# Patient Record
Sex: Female | Born: 1955 | Race: White | Hispanic: No | Smoking: Never smoker
Health system: Southern US, Community
[De-identification: ages and names within clinical notes are randomized; demographics above are authoritative.]

## PROBLEM LIST (undated history)

## (undated) DIAGNOSIS — D75839 Thrombocytosis, unspecified: Secondary | ICD-10-CM

## (undated) DIAGNOSIS — K219 Gastro-esophageal reflux disease without esophagitis: Secondary | ICD-10-CM

## (undated) DIAGNOSIS — E559 Vitamin D deficiency, unspecified: Secondary | ICD-10-CM

## (undated) DIAGNOSIS — B977 Papillomavirus as the cause of diseases classified elsewhere: Secondary | ICD-10-CM

## (undated) DIAGNOSIS — M810 Age-related osteoporosis without current pathological fracture: Secondary | ICD-10-CM

## (undated) HISTORY — DX: Vitamin D deficiency, unspecified: E55.9

## (undated) HISTORY — DX: Age-related osteoporosis without current pathological fracture: M81.0

## (undated) HISTORY — PX: TUBAL LIGATION: SHX77

## (undated) HISTORY — DX: Papillomavirus as the cause of diseases classified elsewhere: B97.7

## (undated) HISTORY — PX: WISDOM TOOTH EXTRACTION: SHX21

## (undated) HISTORY — PX: COLONOSCOPY: SHX174

---

## 1987-03-02 HISTORY — PX: BREAST EXCISIONAL BIOPSY: SUR124

## 1987-03-02 HISTORY — PX: BREAST SURGERY: SHX581

## 1997-03-01 HISTORY — PX: EYE SURGERY: SHX253

## 2004-11-12 ENCOUNTER — Ambulatory Visit: Payer: Self-pay | Admitting: Internal Medicine

## 2004-11-27 ENCOUNTER — Ambulatory Visit: Payer: Self-pay | Admitting: Internal Medicine

## 2005-12-31 ENCOUNTER — Ambulatory Visit: Payer: Self-pay | Admitting: Internal Medicine

## 2006-06-03 ENCOUNTER — Ambulatory Visit: Payer: Self-pay | Admitting: Gastroenterology

## 2007-01-25 ENCOUNTER — Ambulatory Visit: Payer: Self-pay | Admitting: Internal Medicine

## 2008-02-06 ENCOUNTER — Ambulatory Visit: Payer: Self-pay | Admitting: Internal Medicine

## 2009-02-07 ENCOUNTER — Ambulatory Visit: Payer: Self-pay | Admitting: Internal Medicine

## 2010-02-19 ENCOUNTER — Ambulatory Visit: Payer: Self-pay | Admitting: Internal Medicine

## 2011-03-05 LAB — LIPID PANEL
Cholesterol: 193 mg/dL (ref 0–200)
HDL: 73 mg/dL — AB (ref 35–70)

## 2011-03-15 LAB — LIPID PANEL: LDL Cholesterol: 98 mg/dL

## 2011-04-20 ENCOUNTER — Ambulatory Visit: Payer: Self-pay | Admitting: Internal Medicine

## 2012-03-08 ENCOUNTER — Encounter: Payer: Self-pay | Admitting: *Deleted

## 2012-03-08 DIAGNOSIS — M810 Age-related osteoporosis without current pathological fracture: Secondary | ICD-10-CM

## 2012-03-08 DIAGNOSIS — E559 Vitamin D deficiency, unspecified: Secondary | ICD-10-CM

## 2012-03-09 ENCOUNTER — Encounter: Payer: Self-pay | Admitting: Internal Medicine

## 2012-03-09 ENCOUNTER — Ambulatory Visit (INDEPENDENT_AMBULATORY_CARE_PROVIDER_SITE_OTHER): Payer: Managed Care, Other (non HMO) | Admitting: Internal Medicine

## 2012-03-09 ENCOUNTER — Encounter: Payer: Self-pay | Admitting: *Deleted

## 2012-03-09 ENCOUNTER — Other Ambulatory Visit (HOSPITAL_COMMUNITY)
Admission: RE | Admit: 2012-03-09 | Discharge: 2012-03-09 | Disposition: A | Discharge status: Home / Self Care | Payer: Managed Care, Other (non HMO) | Source: Ambulatory Visit | Attending: Internal Medicine | Admitting: Internal Medicine

## 2012-03-09 VITALS — BP 103/68 | HR 67 | Temp 98.5°F | Ht 64.0 in | Wt 151.0 lb

## 2012-03-09 DIAGNOSIS — E559 Vitamin D deficiency, unspecified: Secondary | ICD-10-CM

## 2012-03-09 DIAGNOSIS — R5383 Other fatigue: Secondary | ICD-10-CM

## 2012-03-09 DIAGNOSIS — M810 Age-related osteoporosis without current pathological fracture: Secondary | ICD-10-CM | POA: Insufficient documentation

## 2012-03-09 DIAGNOSIS — Z1151 Encounter for screening for human papillomavirus (HPV): Secondary | ICD-10-CM | POA: Insufficient documentation

## 2012-03-09 DIAGNOSIS — Z01419 Encounter for gynecological examination (general) (routine) without abnormal findings: Secondary | ICD-10-CM | POA: Insufficient documentation

## 2012-03-09 DIAGNOSIS — Z139 Encounter for screening, unspecified: Secondary | ICD-10-CM

## 2012-03-09 LAB — CBC WITH DIFFERENTIAL/PLATELET
Basophils Relative: 0.8 % (ref 0.0–3.0)
Eosinophils Absolute: 0.1 10*3/uL (ref 0.0–0.7)
Eosinophils Relative: 1.2 % (ref 0.0–5.0)
Lymphocytes Relative: 36.3 % (ref 12.0–46.0)
Monocytes Relative: 7.1 % (ref 3.0–12.0)
Neutrophils Relative %: 54.6 % (ref 43.0–77.0)
RBC: 4.57 Mil/uL (ref 3.87–5.11)
WBC: 6.6 10*3/uL (ref 4.5–10.5)

## 2012-03-09 LAB — LIPID PANEL
Cholesterol: 182 mg/dL (ref 0–200)
LDL Cholesterol: 89 mg/dL (ref 0–99)

## 2012-03-09 LAB — COMPREHENSIVE METABOLIC PANEL
AST: 20 U/L (ref 0–37)
Albumin: 4.3 g/dL (ref 3.5–5.2)
BUN: 20 mg/dL (ref 6–23)
Calcium: 9.4 mg/dL (ref 8.4–10.5)
Chloride: 102 mEq/L (ref 96–112)
Glucose, Bld: 90 mg/dL (ref 70–99)
Potassium: 4.1 mEq/L (ref 3.5–5.1)

## 2012-03-12 ENCOUNTER — Encounter: Payer: Self-pay | Admitting: Internal Medicine

## 2012-03-12 NOTE — Assessment & Plan Note (Signed)
Check vitamin D level 

## 2012-03-12 NOTE — Progress Notes (Signed)
  Subjective:    [name], female    DOB: [birth year], 57 y.o.   MRN: [id number]  HPI 57 year old female with past history of osteoporosis who comes in today for a follow up as well as for her complete physical exam.  She states she is doing well.  No chest pain or tightness.  No increased sob.  Breathing stable.  Eating and drinking well.  Overall she feels good.  Bowels stable.   Past Medical History  Diagnosis Date  . Osteoporosis   . Vitamin D deficiency     Current Outpatient Prescriptions on File Prior to Visit  Medication Sig Dispense Refill  . LORazepam (ATIVAN) 0.5 MG tablet Take 0.5 mg by mouth 2 (two) times daily.      . Multiple Vitamins-Minerals (MULTIVITAMIN PO) Take by mouth.      Marland Kitchen VITAMIN D, CHOLECALCIFEROL, PO Take by mouth.        Review of Systems Patient denies any headache, lightheadedness or dizziness.  No sinus or allergy symptoms.   No chest pain, tightness or palpitations.  No increased shortness of breath, cough or congestion.  No nausea or vomiting.  No acid reflux.  No abdominal pain or cramping.  No bowel change, such as diarrhea, constipation, BRBPR or melana.  No urine change.        Objective:   Physical Exam Filed Vitals:   03/09/12 0837  BP: 103/68  Pulse: 67  Temp: 98.5 F (52.59 C)   57 year old female in no acute distress.   HEENT:  Nares- clear.  Oropharynx - without lesions. NECK:  Supple.  Nontender.  No audible bruit.  HEART:  Appears to be regular. LUNGS:  No crackles or wheezing audible.  Respirations even and unlabored.  RADIAL PULSE:  Equal bilaterally.    BREASTS:  No nipple discharge or nipple retraction present.  Could not appreciate any distinct nodules or axillary adenopathy.  ABDOMEN:  Soft, nontender.  Bowel sounds present and normal.  No audible abdominal bruit.  GU:  Normal external genitalia.  Vaginal vault without lesions.  Cervix identified.  Pap performed. Could not appreciate any adnexal masses or  tenderness.   RECTAL:  Heme negative.   EXTREMITIES:  No increased edema present.  DP pulses palpable and equal bilaterally.           Assessment & Plan:  INCREASED PSYCHOSOCIAL STRESSORS.  Doing well.  Has lorazepam if needed.  Rarely uses.   FATIGUE.  Did report some fatigue, but overall feels she is doing well.  Check cbc, met c and tsh.    HEALTH MAINTENANCE.  Physical today.  Schedule mammogram.  Colonoscopy 4/4 08 with two 3mm polyps removed. Recommended follow up in 10 years.  Hyperplastic polyps.  IFOB given.

## 2012-03-12 NOTE — Assessment & Plan Note (Signed)
Off Fosamax.  Continue calcium and vitamin D.  Continue weight bearing exercises.  Check vitamin D level.  Will follow with repeat bone density - when agreeable.

## 2012-03-14 ENCOUNTER — Encounter: Payer: Self-pay | Admitting: *Deleted

## 2012-04-20 ENCOUNTER — Ambulatory Visit: Payer: Self-pay | Admitting: Internal Medicine

## 2012-05-03 ENCOUNTER — Encounter: Payer: Self-pay | Admitting: Internal Medicine

## 2013-03-12 ENCOUNTER — Encounter: Payer: Self-pay | Admitting: Internal Medicine

## 2013-03-12 ENCOUNTER — Other Ambulatory Visit: Payer: Self-pay | Admitting: Internal Medicine

## 2013-03-12 ENCOUNTER — Ambulatory Visit (INDEPENDENT_AMBULATORY_CARE_PROVIDER_SITE_OTHER): Payer: Managed Care, Other (non HMO) | Admitting: Internal Medicine

## 2013-03-12 VITALS — BP 120/80 | HR 76 | Temp 97.7°F | Ht 63.0 in | Wt 141.2 lb

## 2013-03-12 DIAGNOSIS — Z0000 Encounter for general adult medical examination without abnormal findings: Secondary | ICD-10-CM

## 2013-03-12 DIAGNOSIS — Z13220 Encounter for screening for lipoid disorders: Secondary | ICD-10-CM

## 2013-03-12 DIAGNOSIS — E559 Vitamin D deficiency, unspecified: Secondary | ICD-10-CM

## 2013-03-12 DIAGNOSIS — Z1211 Encounter for screening for malignant neoplasm of colon: Secondary | ICD-10-CM

## 2013-03-12 DIAGNOSIS — M810 Age-related osteoporosis without current pathological fracture: Secondary | ICD-10-CM

## 2013-03-12 DIAGNOSIS — Z1239 Encounter for other screening for malignant neoplasm of breast: Secondary | ICD-10-CM

## 2013-03-12 LAB — CBC WITH DIFFERENTIAL/PLATELET
BASOS PCT: 0.7 % (ref 0.0–3.0)
Basophils Absolute: 0 10*3/uL (ref 0.0–0.1)
EOS PCT: 2 % (ref 0.0–5.0)
Eosinophils Absolute: 0.1 10*3/uL (ref 0.0–0.7)
HCT: 38.9 % (ref 36.0–46.0)
Hemoglobin: 13 g/dL (ref 12.0–15.0)
LYMPHS PCT: 41.3 % (ref 12.0–46.0)
Lymphs Abs: 2.5 10*3/uL (ref 0.7–4.0)
MCHC: 33.5 g/dL (ref 30.0–36.0)
MCV: 89.3 fl (ref 78.0–100.0)
Monocytes Absolute: 0.4 10*3/uL (ref 0.1–1.0)
Monocytes Relative: 7.1 % (ref 3.0–12.0)
NEUTROS PCT: 48.9 % (ref 43.0–77.0)
Neutro Abs: 3 10*3/uL (ref 1.4–7.7)
PLATELETS: 303 10*3/uL (ref 150.0–400.0)
RBC: 4.35 Mil/uL (ref 3.87–5.11)
RDW: 14.4 % (ref 11.5–14.6)
WBC: 6.1 10*3/uL (ref 4.5–10.5)

## 2013-03-12 LAB — COMPREHENSIVE METABOLIC PANEL
ALBUMIN: 4.3 g/dL (ref 3.5–5.2)
ALK PHOS: 75 U/L (ref 39–117)
ALT: 16 U/L (ref 0–35)
AST: 17 U/L (ref 0–37)
BUN: 17 mg/dL (ref 6–23)
CALCIUM: 9.6 mg/dL (ref 8.4–10.5)
CHLORIDE: 107 meq/L (ref 96–112)
CO2: 28 mEq/L (ref 19–32)
Creatinine, Ser: 0.7 mg/dL (ref 0.4–1.2)
GFR: 97.32 mL/min (ref >60.00)
Glucose, Bld: 86 mg/dL (ref 70–99)
POTASSIUM: 4.6 meq/L (ref 3.5–5.1)
SODIUM: 140 meq/L (ref 135–145)
TOTAL PROTEIN: 6.9 g/dL (ref 6.0–8.3)
Total Bilirubin: 0.5 mg/dL (ref 0.3–1.2)

## 2013-03-12 LAB — LIPID PANEL
CHOLESTEROL: 185 mg/dL (ref 0–200)
HDL: 72.1 mg/dL (ref >39.00)
LDL Cholesterol: 102 mg/dL — ABNORMAL HIGH (ref 0–99)
Total CHOL/HDL Ratio: 3
Triglycerides: 56 mg/dL (ref 0.0–149.0)
VLDL: 11.2 mg/dL (ref 0.0–40.0)

## 2013-03-12 LAB — TSH: TSH: 1.58 u[IU]/mL (ref 0.35–5.50)

## 2013-03-12 NOTE — Progress Notes (Signed)
  Subjective:    [name], female    DOB: 02-02-1956, 58 y.o.   MRN: [id number]  HPI 58 year old female with past history of osteoporosis who comes in today for a follow up as well as for her complete physical exam.  She states she is doing well.  No chest pain or tightness.  No increased sob.  Breathing stable.  Eating and drinking well.  Overall she feels good.  Bowels stable.  Staying active.  No cardiac symptoms with increased activity or exertion.   Past Medical History  Diagnosis Date  . Osteoporosis   . Vitamin D deficiency     Current Outpatient Prescriptions on File Prior to Visit  Medication Sig Dispense Refill  . Multiple Vitamins-Minerals (MULTIVITAMIN PO) Take by mouth.      Marland Kitchen VITAMIN D, CHOLECALCIFEROL, PO Take by mouth.       No current facility-administered medications on file prior to visit.    Review of Systems Patient denies any headache, lightheadedness or dizziness.  No sinus or allergy symptoms.   No chest pain, tightness or palpitations.  No increased shortness of breath, cough or congestion.  No nausea or vomiting.  No acid reflux.  No abdominal pain or cramping.  No bowel change, such as diarrhea, constipation, BRBPR or melana.  No urine change.   No vaginal problems.       Objective:   Physical Exam  Filed Vitals:   03/12/13 0814  BP: 120/80  Pulse: 76  Temp: 97.7 F (36.5 C)   Blood pressure recheck:  70/61  59 year old female in no acute distress.   HEENT:  Nares- clear.  Oropharynx - without lesions. NECK:  Supple.  Nontender.  No audible bruit.  HEART:  Appears to be regular. LUNGS:  No crackles or wheezing audible.  Respirations even and unlabored.  RADIAL PULSE:  Equal bilaterally.    BREASTS:  No nipple discharge or nipple retraction present.  Could not appreciate any distinct nodules or axillary adenopathy.  ABDOMEN:  Soft, nontender.  Bowel sounds present and normal.  No audible abdominal bruit.  GU:  Not performed.     EXTREMITIES:  No increased edema present.  DP pulses palpable and equal bilaterally.           Assessment & Plan:  INCREASED PSYCHOSOCIAL STRESSORS.  Doing well.  Has lorazepam if needed.  Rarely uses.   HEALTH MAINTENANCE.  Physical today.  Schedule mammogram.  Last mammogram 04/20/12 - Birads I.  Colonoscopy 4/4 08 with two 59mm polyps removed. Recommended follow up in 10 years.  Hyperplastic polyps.  IFOB given.   I spent 25 minutes with the patient and more than 50% of the time was spent in consultation regarding the above.

## 2013-03-12 NOTE — Addendum Note (Signed)
Addended by: Karlene Einstein D on: 03/12/2013 09:04 AM   Modules accepted: Orders

## 2013-03-12 NOTE — Assessment & Plan Note (Addendum)
Off Fosamax.  Continue calcium and vitamin D.  Continue weight bearing exercises.  Check vitamin D level.  Will follow with repeat bone density - when agreeable.  We discussed repeat today.  She wants to hold off at this time.

## 2013-03-12 NOTE — Progress Notes (Signed)
Orders placed for labs

## 2013-03-12 NOTE — Assessment & Plan Note (Addendum)
Check vitamin D level.  Continue vitamin D supplementation.

## 2013-03-12 NOTE — Progress Notes (Signed)
Pre-visit discussion using our clinic review tool. No additional management support is needed unless otherwise documented below in the visit note.  

## 2013-03-13 ENCOUNTER — Encounter: Payer: Self-pay | Admitting: Internal Medicine

## 2013-03-13 LAB — VITAMIN D 25 HYDROXY (VIT D DEFICIENCY, FRACTURES): VIT D 25 HYDROXY: 42 ng/mL (ref 30–89)

## 2013-04-23 ENCOUNTER — Ambulatory Visit: Payer: Self-pay | Admitting: Internal Medicine

## 2013-04-23 LAB — HM MAMMOGRAPHY: HM MAMMO: NEGATIVE

## 2013-04-30 ENCOUNTER — Encounter: Payer: Self-pay | Admitting: Internal Medicine

## 2013-05-14 ENCOUNTER — Encounter: Payer: Self-pay | Admitting: Internal Medicine

## 2013-11-23 ENCOUNTER — Telehealth: Payer: Self-pay | Admitting: Internal Medicine

## 2013-11-23 NOTE — Telephone Encounter (Signed)
Given to Dr. Nicki Reaper for signature

## 2013-11-23 NOTE — Telephone Encounter (Signed)
Pt dropped health screening form to be filled out. Form is in Dr. Bary Leriche box/msn

## 2013-11-27 NOTE — Telephone Encounter (Signed)
Form faxed & original mailed to patient. Copy also made for scan

## 2013-11-27 NOTE — Telephone Encounter (Signed)
Notified patient via voicemail. 

## 2014-03-14 ENCOUNTER — Encounter: Payer: Self-pay | Admitting: Internal Medicine

## 2014-03-14 ENCOUNTER — Ambulatory Visit (INDEPENDENT_AMBULATORY_CARE_PROVIDER_SITE_OTHER): Payer: Managed Care, Other (non HMO) | Admitting: Internal Medicine

## 2014-03-14 VITALS — BP 100/70 | HR 84 | Temp 98.1°F | Ht 63.0 in | Wt 148.5 lb

## 2014-03-14 DIAGNOSIS — M810 Age-related osteoporosis without current pathological fracture: Secondary | ICD-10-CM

## 2014-03-14 DIAGNOSIS — Z13220 Encounter for screening for lipoid disorders: Secondary | ICD-10-CM

## 2014-03-14 DIAGNOSIS — R5383 Other fatigue: Secondary | ICD-10-CM

## 2014-03-14 DIAGNOSIS — Z1239 Encounter for other screening for malignant neoplasm of breast: Secondary | ICD-10-CM

## 2014-03-14 DIAGNOSIS — E559 Vitamin D deficiency, unspecified: Secondary | ICD-10-CM

## 2014-03-14 LAB — CBC WITH DIFFERENTIAL/PLATELET
BASOS ABS: 0 10*3/uL (ref 0.0–0.1)
Basophils Relative: 0.6 % (ref 0.0–3.0)
Eosinophils Absolute: 0.1 10*3/uL (ref 0.0–0.7)
Eosinophils Relative: 1.4 % (ref 0.0–5.0)
HEMATOCRIT: 40.7 % (ref 36.0–46.0)
HEMOGLOBIN: 13.3 g/dL (ref 12.0–15.0)
LYMPHS PCT: 35.2 % (ref 12.0–46.0)
Lymphs Abs: 2.7 10*3/uL (ref 0.7–4.0)
MCHC: 32.5 g/dL (ref 30.0–36.0)
MCV: 89.9 fl (ref 78.0–100.0)
Monocytes Absolute: 0.6 10*3/uL (ref 0.1–1.0)
Monocytes Relative: 8.5 % (ref 3.0–12.0)
Neutro Abs: 4.1 10*3/uL (ref 1.4–7.7)
Neutrophils Relative %: 54.3 % (ref 43.0–77.0)
PLATELETS: 314 10*3/uL (ref 150.0–400.0)
RBC: 4.53 Mil/uL (ref 3.87–5.11)
RDW: 13.7 % (ref 11.5–15.5)
WBC: 7.6 10*3/uL (ref 4.0–10.5)

## 2014-03-14 LAB — LIPID PANEL
CHOLESTEROL: 174 mg/dL (ref 0–200)
HDL: 75.2 mg/dL (ref >39.00)
LDL Cholesterol: 86 mg/dL (ref 0–99)
NONHDL: 98.8
Total CHOL/HDL Ratio: 2
Triglycerides: 62 mg/dL (ref 0.0–149.0)
VLDL: 12.4 mg/dL (ref 0.0–40.0)

## 2014-03-14 LAB — COMPREHENSIVE METABOLIC PANEL
ALBUMIN: 4.4 g/dL (ref 3.5–5.2)
ALK PHOS: 88 U/L (ref 39–117)
ALT: 14 U/L (ref 0–35)
AST: 16 U/L (ref 0–37)
BUN: 15 mg/dL (ref 6–23)
CALCIUM: 9.7 mg/dL (ref 8.4–10.5)
CO2: 28 mEq/L (ref 19–32)
Chloride: 103 mEq/L (ref 96–112)
Creatinine, Ser: 0.73 mg/dL (ref 0.40–1.20)
GFR: 86.34 mL/min (ref >60.00)
Glucose, Bld: 93 mg/dL (ref 70–99)
Potassium: 4.6 mEq/L (ref 3.5–5.1)
Sodium: 138 mEq/L (ref 135–145)
Total Bilirubin: 0.4 mg/dL (ref 0.2–1.2)
Total Protein: 7 g/dL (ref 6.0–8.3)

## 2014-03-14 LAB — VITAMIN D 25 HYDROXY (VIT D DEFICIENCY, FRACTURES): VITD: 24.61 ng/mL — ABNORMAL LOW (ref 30.00–100.00)

## 2014-03-14 LAB — TSH: TSH: 1.52 u[IU]/mL (ref 0.35–4.50)

## 2014-03-14 NOTE — Progress Notes (Signed)
  Subjective:    [name], female    DOB: Sep 23, 1955, 59 y.o.   MRN: [id number]  HPI 59 year old female with past history of osteoporosis who comes in today for a follow up as well as for her complete physical exam.  She states she is doing well.  No chest pain or tightness.  No increased sob.  Breathing stable.  Eating and drinking well.  Overall she feels good.  Bowels stable.  Staying active.  No cardiac symptoms with increased activity or exertion.  Planning to retire 05/2014.     Past Medical History  Diagnosis Date  . Osteoporosis   . Vitamin D deficiency     Current Outpatient Prescriptions on File Prior to Visit  Medication Sig Dispense Refill  . Multiple Vitamins-Minerals (MULTIVITAMIN PO) Take by mouth.    Marland Kitchen VITAMIN D, CHOLECALCIFEROL, PO Take by mouth.     No current facility-administered medications on file prior to visit.    Review of Systems Patient denies any headache, lightheadedness or dizziness.  No sinus or allergy symptoms.   No chest pain, tightness or palpitations.  No increased shortness of breath, cough or congestion.  No nausea or vomiting.  No acid reflux.  No abdominal pain or cramping.  No bowel change, such as diarrhea, constipation, BRBPR or melana.  No urine change.   No vaginal problems.  Planning to retire 05/2014.       Objective:   Physical Exam  Filed Vitals:   03/14/14 0829  BP: 100/70  Pulse: 84  Temp: 98.1 F (36.7 C)   Pulse 75-70  59 year old female in no acute distress.   HEENT:  Nares- clear.  Oropharynx - without lesions. NECK:  Supple.  Nontender.  No audible bruit.  HEART:  Appears to be regular. LUNGS:  No crackles or wheezing audible.  Respirations even and unlabored.  RADIAL PULSE:  Equal bilaterally.    BREASTS:  No nipple discharge or nipple retraction present.  Could not appreciate any distinct nodules or axillary adenopathy.  ABDOMEN:  Soft, nontender.  Bowel sounds present and normal.  No audible  abdominal bruit.  GU:  Not performed.    EXTREMITIES:  No increased edema present.  DP pulses palpable and equal bilaterally.           Assessment & Plan:  1. Breast cancer screening - MM DIGITAL SCREENING BILATERAL; Future  2. Osteoporosis She desires not to have bone density at this time.  Check vitamin D level.  Weight bearing exercise.  Dietary calcium.   - CBC with Differential - TSH  3. Vitamin D deficiency - Vit D  25 hydroxy (rtn osteoporosis monitoring)  4. Other fatigue - Comprehensive metabolic panel  5. Screening cholesterol level - Lipid panel  6. INCREASED PSYCHOSOCIAL STRESSORS.  Doing well.    HEALTH MAINTENANCE.  Physical today.  PAP 03/09/12 negative with negative HPV.  Schedule mammogram.  Last mammogram 04/23/13 - Birads I.  Colonoscopy 4/4 08 with two 51mm polyps removed. Recommended follow up in 10 years.  Hyperplastic polyps.   IFOB sent to pt.

## 2014-03-14 NOTE — Progress Notes (Signed)
Pre visit review using our clinic review tool, if applicable. No additional management support is needed unless otherwise documented below in the visit note. 

## 2014-03-15 ENCOUNTER — Encounter: Payer: Self-pay | Admitting: Internal Medicine

## 2014-03-17 ENCOUNTER — Encounter: Payer: Self-pay | Admitting: Internal Medicine

## 2014-03-17 ENCOUNTER — Telehealth: Payer: Self-pay | Admitting: Internal Medicine

## 2014-03-17 DIAGNOSIS — Z1211 Encounter for screening for malignant neoplasm of colon: Secondary | ICD-10-CM

## 2014-03-17 NOTE — Telephone Encounter (Signed)
I forgot to give her IFOB when here for appt.  Please send to her or see if she can pick up.

## 2014-03-18 NOTE — Telephone Encounter (Signed)
Mailed IFOB kit to pt.

## 2014-04-24 ENCOUNTER — Ambulatory Visit: Payer: Self-pay | Admitting: Internal Medicine

## 2014-04-24 LAB — HM MAMMOGRAPHY: HM Mammogram: NEGATIVE

## 2014-04-26 ENCOUNTER — Encounter: Payer: Self-pay | Admitting: Internal Medicine

## 2015-04-02 DIAGNOSIS — B977 Papillomavirus as the cause of diseases classified elsewhere: Secondary | ICD-10-CM

## 2015-04-02 HISTORY — DX: Papillomavirus as the cause of diseases classified elsewhere: B97.7

## 2015-04-29 ENCOUNTER — Encounter: Payer: Self-pay | Admitting: Internal Medicine

## 2015-04-29 ENCOUNTER — Ambulatory Visit (INDEPENDENT_AMBULATORY_CARE_PROVIDER_SITE_OTHER): Payer: Managed Care, Other (non HMO) | Admitting: Internal Medicine

## 2015-04-29 ENCOUNTER — Other Ambulatory Visit (HOSPITAL_COMMUNITY)
Admission: RE | Admit: 2015-04-29 | Discharge: 2015-04-29 | Disposition: A | Discharge status: Home / Self Care | Payer: Managed Care, Other (non HMO) | Source: Ambulatory Visit | Attending: Internal Medicine | Admitting: Internal Medicine

## 2015-04-29 VITALS — BP 110/70 | HR 89 | Temp 98.6°F | Resp 18 | Ht 62.75 in | Wt 153.4 lb

## 2015-04-29 DIAGNOSIS — Z1239 Encounter for other screening for malignant neoplasm of breast: Secondary | ICD-10-CM | POA: Diagnosis not present

## 2015-04-29 DIAGNOSIS — Z01419 Encounter for gynecological examination (general) (routine) without abnormal findings: Secondary | ICD-10-CM | POA: Insufficient documentation

## 2015-04-29 DIAGNOSIS — Z124 Encounter for screening for malignant neoplasm of cervix: Secondary | ICD-10-CM

## 2015-04-29 DIAGNOSIS — Z13220 Encounter for screening for lipoid disorders: Secondary | ICD-10-CM

## 2015-04-29 DIAGNOSIS — Z1151 Encounter for screening for human papillomavirus (HPV): Secondary | ICD-10-CM | POA: Diagnosis not present

## 2015-04-29 DIAGNOSIS — M810 Age-related osteoporosis without current pathological fracture: Secondary | ICD-10-CM

## 2015-04-29 DIAGNOSIS — Z0000 Encounter for general adult medical examination without abnormal findings: Secondary | ICD-10-CM | POA: Diagnosis not present

## 2015-04-29 DIAGNOSIS — E559 Vitamin D deficiency, unspecified: Secondary | ICD-10-CM

## 2015-04-29 MED ORDER — TRIAMCINOLONE ACETONIDE 0.5 % EX CREA: 1.0000 "application " | TOPICAL_CREAM | Freq: Two times a day (BID) | CUTANEOUS | Status: DC | PRN

## 2015-04-29 NOTE — Progress Notes (Signed)
[name], female   DOB: 1955-04-27, 60 y.o.   MRN: [id number]   Subjective:    [name], female    DOB: [birth year], 60 y.o.   MRN: [id number]  HPI  Patient with past history of osteoporosis who comes in today for his physical exam.  She is going to the gym three times per week.  No chest pain or tightness with increased activity or exertion.  No sob.  No acid reflux.  No abdominal pain or cramping.  Bowels stable.     Past Medical History  Diagnosis Date  . Osteoporosis   . Vitamin D deficiency    Past Surgical History  Procedure Laterality Date  . Breast surgery  1989    left breast biopsy  . Tubal ligation     Family History  Problem Relation Age of Onset  . Breast cancer Mother     died age 93  . Parkinson's disease Father   . Lymphoma Maternal Grandmother     non-hodgkin's   Social History   Social History  . Marital Status: Married    Spouse Name: N/A  . Number of Children: 1  . Years of Education: N/A   Social History Main Topics  . Smoking status: Never Smoker   . Smokeless tobacco: Never Used  . Alcohol Use: 0.0 oz/week    0 Standard drinks or equivalent per week     Comment: occasional  . Drug Use: No  . Sexual Activity: Not Asked   Other Topics Concern  . None   Social History Narrative    Outpatient Encounter Prescriptions as of 04/29/2015  Medication Sig  . Multiple Vitamins-Minerals (MULTIVITAMIN PO) Take by mouth.  Marland Kitchen VITAMIN D, CHOLECALCIFEROL, PO Take by mouth.  . triamcinolone cream (KENALOG) 0.5 % Apply 1 application topically 2 (two) times daily as needed.   No facility-administered encounter medications on file as of 04/29/2015.    Review of Systems  Constitutional: Negative for appetite change and unexpected weight change.  HENT: Negative for congestion and sinus pressure.   Eyes: Negative for pain and visual disturbance.  Respiratory: Negative for cough, chest tightness and shortness of breath.    Cardiovascular: Negative for chest pain, palpitations and leg swelling.  Gastrointestinal: Negative for nausea, vomiting, abdominal pain and diarrhea.  Genitourinary: Negative for dysuria and difficulty urinating.  Musculoskeletal: Negative for back pain and joint swelling.  Skin: Negative for color change and rash.  Neurological: Negative for dizziness, light-headedness and headaches.  Hematological: Negative for adenopathy. Does not bruise/bleed easily.  Psychiatric/Behavioral: Negative for dysphoric mood and agitation.       Objective:    Physical Exam  Constitutional: She is oriented to person, place, and time. She appears well-developed and well-nourished. No distress.  HENT:  Nose: Nose normal.  Mouth/Throat: Oropharynx is clear and moist.  Eyes: Right eye exhibits no discharge. Left eye exhibits no discharge. No scleral icterus.  Neck: Neck supple. No thyromegaly present.  Cardiovascular: Normal rate and regular rhythm.   Pulmonary/Chest: Breath sounds normal. No accessory muscle usage. No tachypnea. No respiratory distress. She has no decreased breath sounds. She has no wheezes. She has no rhonchi. Right breast exhibits no inverted nipple, no mass, no nipple discharge and no tenderness (no axillary adenopathy). Left breast exhibits no inverted nipple, no mass, no nipple discharge and no tenderness (no axilarry adenopathy).  Abdominal: Soft. Bowel sounds are normal. There is no tenderness.  Genitourinary:  Normal external genitalia.  Vaginal vault without lesions.  Cervix identified.  Pap smear performed.  Could not appreciate any adnexal masses or tenderness.    Musculoskeletal: She exhibits no edema or tenderness.  Lymphadenopathy:    She has no cervical adenopathy.  Neurological: She is alert and oriented to person, place, and time.  Skin: Skin is warm. No rash noted. No erythema.  Psychiatric: She has a normal mood and affect. Her behavior is normal.    BP 110/70 mmHg   Pulse 89  Temp(Src) 98.6 F (37 C) (Oral)  Resp 18  Ht 5' 2.75" (1.594 m)  Wt 153 lb 6 oz (69.57 kg)  BMI 27.38 kg/m2  SpO2 97% Wt Readings from Last 3 Encounters:  04/29/15 153 lb 6 oz (69.57 kg)  03/14/14 148 lb 8 oz (67.359 kg)  03/12/13 141 lb 4 oz (64.071 kg)     Lab Results  Component Value Date   WBC 7.6 03/14/2014   HGB 13.3 03/14/2014   HCT 40.7 03/14/2014   PLT 314.0 03/14/2014   GLUCOSE 93 03/14/2014   CHOL 174 03/14/2014   TRIG 62.0 03/14/2014   HDL 75.20 03/14/2014   LDLCALC 86 03/14/2014   ALT 14 03/14/2014   AST 16 03/14/2014   NA 138 03/14/2014   K 4.6 03/14/2014   CL 103 03/14/2014   CREATININE 0.73 03/14/2014   BUN 15 03/14/2014   CO2 28 03/14/2014   TSH 1.52 03/14/2014       Assessment & Plan:   Problem List Items Addressed This Visit    Healthcare maintenance    Physical today 04/29/15.  PAP 04/29/15.  Schedule mammogram.  Colonoscopy 05/2006 - two 2-23mm polyps.  Recommended f/u colonoscopy in 10 years.        Osteoporosis    Off fosamax.  Continue weight bearing exercise.  Follow.  Will notify me when agreeable for f/u bone density.        Relevant Orders   CBC with Differential/Platelet   Comprehensive metabolic panel   TSH   Vitamin D deficiency    Will follow vitamin D level.       Relevant Orders   VITAMIN D 25 Hydroxy (Vit-D Deficiency, Fractures)    Other Visit Diagnoses    Routine general medical examination at a health care facility    -  Primary    Breast cancer screening        Relevant Orders    MM DIGITAL SCREENING BILATERAL    Pap smear for cervical cancer screening        Relevant Orders    Cytology - PAP (Completed)    Screening cholesterol level        Relevant Orders    Lipid panel        Einar Pheasant, MD

## 2015-04-29 NOTE — Progress Notes (Signed)
Pre-visit discussion using our clinic review tool. No additional management support is needed unless otherwise documented below in the visit note.  

## 2015-05-01 LAB — CYTOLOGY - PAP

## 2015-05-02 ENCOUNTER — Encounter: Payer: Self-pay | Admitting: *Deleted

## 2015-05-04 ENCOUNTER — Encounter: Payer: Self-pay | Admitting: Internal Medicine

## 2015-05-04 DIAGNOSIS — Z0000 Encounter for general adult medical examination without abnormal findings: Secondary | ICD-10-CM | POA: Insufficient documentation

## 2015-05-04 NOTE — Assessment & Plan Note (Signed)
Will follow vitamin D level.

## 2015-05-04 NOTE — Assessment & Plan Note (Signed)
Off fosamax.  Continue weight bearing exercise.  Follow.  Will notify me when agreeable for f/u bone density.

## 2015-05-04 NOTE — Assessment & Plan Note (Addendum)
Physical today 04/29/15.  PAP 04/29/15.  Schedule mammogram.  Colonoscopy 05/2006 - two 2-57mm polyps.  Recommended f/u colonoscopy in 10 years.

## 2015-05-06 ENCOUNTER — Other Ambulatory Visit (INDEPENDENT_AMBULATORY_CARE_PROVIDER_SITE_OTHER): Payer: Managed Care, Other (non HMO)

## 2015-05-06 DIAGNOSIS — E559 Vitamin D deficiency, unspecified: Secondary | ICD-10-CM

## 2015-05-06 DIAGNOSIS — Z13220 Encounter for screening for lipoid disorders: Secondary | ICD-10-CM

## 2015-05-06 DIAGNOSIS — M810 Age-related osteoporosis without current pathological fracture: Secondary | ICD-10-CM

## 2015-05-06 LAB — COMPREHENSIVE METABOLIC PANEL
ALBUMIN: 4.4 g/dL (ref 3.5–5.2)
ALT: 16 U/L (ref 0–35)
AST: 20 U/L (ref 0–37)
Alkaline Phosphatase: 92 U/L (ref 39–117)
BILIRUBIN TOTAL: 0.4 mg/dL (ref 0.2–1.2)
BUN: 12 mg/dL (ref 6–23)
CALCIUM: 9.8 mg/dL (ref 8.4–10.5)
CO2: 26 meq/L (ref 19–32)
CREATININE: 0.74 mg/dL (ref 0.40–1.20)
Chloride: 103 mEq/L (ref 96–112)
GFR: 84.67 mL/min (ref >60.00)
Glucose, Bld: 93 mg/dL (ref 70–99)
Potassium: 4.3 mEq/L (ref 3.5–5.1)
Sodium: 140 mEq/L (ref 135–145)
TOTAL PROTEIN: 7.2 g/dL (ref 6.0–8.3)

## 2015-05-06 LAB — LIPID PANEL
CHOLESTEROL: 177 mg/dL (ref 0–200)
HDL: 71 mg/dL (ref >39.00)
LDL CALC: 92 mg/dL (ref 0–99)
NonHDL: 106.26
Total CHOL/HDL Ratio: 2
Triglycerides: 71 mg/dL (ref 0.0–149.0)
VLDL: 14.2 mg/dL (ref 0.0–40.0)

## 2015-05-06 LAB — CBC WITH DIFFERENTIAL/PLATELET
BASOS ABS: 0.1 10*3/uL (ref 0.0–0.1)
Basophils Relative: 0.6 % (ref 0.0–3.0)
EOS ABS: 0.2 10*3/uL (ref 0.0–0.7)
Eosinophils Relative: 1.6 % (ref 0.0–5.0)
HEMATOCRIT: 42.2 % (ref 36.0–46.0)
HEMOGLOBIN: 13.9 g/dL (ref 12.0–15.0)
LYMPHS PCT: 27.3 % (ref 12.0–46.0)
Lymphs Abs: 2.5 10*3/uL (ref 0.7–4.0)
MCHC: 32.8 g/dL (ref 30.0–36.0)
MCV: 89.4 fl (ref 78.0–100.0)
Monocytes Absolute: 0.7 10*3/uL (ref 0.1–1.0)
Monocytes Relative: 7.6 % (ref 3.0–12.0)
Neutro Abs: 5.9 10*3/uL (ref 1.4–7.7)
Neutrophils Relative %: 62.9 % (ref 43.0–77.0)
PLATELETS: 374 10*3/uL (ref 150.0–400.0)
RBC: 4.72 Mil/uL (ref 3.87–5.11)
RDW: 14.4 % (ref 11.5–15.5)
WBC: 9.3 10*3/uL (ref 4.0–10.5)

## 2015-05-06 LAB — TSH: TSH: 2.07 u[IU]/mL (ref 0.35–4.50)

## 2015-05-06 LAB — VITAMIN D 25 HYDROXY (VIT D DEFICIENCY, FRACTURES): VITD: 26.86 ng/mL — AB (ref 30.00–100.00)

## 2015-05-07 ENCOUNTER — Encounter: Payer: Self-pay | Admitting: *Deleted

## 2015-05-07 ENCOUNTER — Encounter: Payer: Self-pay | Admitting: Internal Medicine

## 2015-05-07 ENCOUNTER — Ambulatory Visit
Admission: RE | Admit: 2015-05-07 | Discharge: 2015-05-07 | Disposition: A | Discharge status: Home / Self Care | Payer: Managed Care, Other (non HMO) | Source: Ambulatory Visit | Attending: Internal Medicine | Admitting: Internal Medicine

## 2015-05-07 DIAGNOSIS — Z1231 Encounter for screening mammogram for malignant neoplasm of breast: Secondary | ICD-10-CM | POA: Diagnosis present

## 2015-05-07 DIAGNOSIS — Z1239 Encounter for other screening for malignant neoplasm of breast: Secondary | ICD-10-CM

## 2015-10-14 ENCOUNTER — Other Ambulatory Visit (HOSPITAL_COMMUNITY)
Admission: RE | Admit: 2015-10-14 | Discharge: 2015-10-14 | Disposition: A | Discharge status: Home / Self Care | Payer: Managed Care, Other (non HMO) | Source: Ambulatory Visit | Attending: Internal Medicine | Admitting: Internal Medicine

## 2015-10-14 ENCOUNTER — Encounter: Payer: Self-pay | Admitting: Internal Medicine

## 2015-10-14 ENCOUNTER — Ambulatory Visit (INDEPENDENT_AMBULATORY_CARE_PROVIDER_SITE_OTHER): Payer: Managed Care, Other (non HMO) | Admitting: Internal Medicine

## 2015-10-14 VITALS — BP 102/70 | HR 62 | Temp 98.9°F | Resp 16 | Wt 151.0 lb

## 2015-10-14 DIAGNOSIS — Z01419 Encounter for gynecological examination (general) (routine) without abnormal findings: Secondary | ICD-10-CM | POA: Diagnosis present

## 2015-10-14 DIAGNOSIS — Z1151 Encounter for screening for human papillomavirus (HPV): Secondary | ICD-10-CM | POA: Diagnosis not present

## 2015-10-14 DIAGNOSIS — Z124 Encounter for screening for malignant neoplasm of cervix: Secondary | ICD-10-CM | POA: Diagnosis not present

## 2015-10-14 DIAGNOSIS — IMO0002 Reserved for concepts with insufficient information to code with codable children: Secondary | ICD-10-CM

## 2015-10-14 DIAGNOSIS — R888 Abnormal findings in other body fluids and substances: Secondary | ICD-10-CM

## 2015-10-14 DIAGNOSIS — B977 Papillomavirus as the cause of diseases classified elsewhere: Secondary | ICD-10-CM

## 2015-10-14 DIAGNOSIS — Z8742 Personal history of other diseases of the female genital tract: Secondary | ICD-10-CM | POA: Insufficient documentation

## 2015-10-14 NOTE — Progress Notes (Signed)
[name], female   DOB: [birth year], 60 y.o.   MRN: [id number]   Subjective:    [name], female    DOB: 1955/07/09, 60 y.o.   MRN: [id number]  HPI  Patient here for scheduled follow up - repeat pap smear. She is doing well.  Still going to the gym.  No chest pain.  No sob.  On abdominal pain or cramping.  No vaginal problems.  Discussed HPV.  Discussed repeat pap.     Past Medical History:  Diagnosis Date  . Osteoporosis   . Vitamin D deficiency    Past Surgical History:  Procedure Laterality Date  . BREAST EXCISIONAL BIOPSY Left 1989   benign  . BREAST SURGERY  1989   left breast biopsy  . TUBAL LIGATION     Family History  Problem Relation Age of Onset  . Breast cancer Mother 60    died age 47  . Parkinson's disease Father   . Lymphoma Maternal Grandmother     non-hodgkin's   Social History   Social History  . Marital status: Married    Spouse name: N/A  . Number of children: 1  . Years of education: N/A   Social History Main Topics  . Smoking status: Never Smoker  . Smokeless tobacco: Never Used  . Alcohol use 0.0 oz/week     Comment: occasional  . Drug use: No  . Sexual activity: Not Asked   Other Topics Concern  . None   Social History Narrative  . None    Outpatient Encounter Prescriptions as of 10/14/2015  Medication Sig  . VITAMIN D, CHOLECALCIFEROL, PO Take by mouth.  . Multiple Vitamins-Minerals (MULTIVITAMIN PO) Take by mouth.  . triamcinolone cream (KENALOG) 0.5 % Apply 1 application topically 2 (two) times daily as needed. (Patient not taking: Reported on 10/14/2015)   No facility-administered encounter medications on file as of 10/14/2015.     Review of Systems  Constitutional: Negative for appetite change and unexpected weight change.  Respiratory: Negative for shortness of breath.   Cardiovascular: Negative for chest pain and leg swelling.  Genitourinary:       No vaginal symptoms.   Skin:  Negative for color change and rash.  Psychiatric/Behavioral: Negative for agitation and dysphoric mood.       Objective:    Physical Exam  Constitutional: She appears well-developed and well-nourished. No distress.  Neck: Neck supple.  Cardiovascular: Normal rate and regular rhythm.   Pulmonary/Chest: Breath sounds normal. No respiratory distress. She has no wheezes.  Abdominal: Soft. Bowel sounds are normal. There is no tenderness.  Genitourinary:  Genitourinary Comments: Normal external genitalia.  Vaginal vault without lesions.  Cervix identified.  Pap smear performed. Stenotic os.  Could not appreciate any adnexal masses or tenderness.    Musculoskeletal: She exhibits no edema.  Lymphadenopathy:    She has no cervical adenopathy.  Psychiatric: She has a normal mood and affect. Her behavior is normal.    BP 102/70   Pulse 62   Temp 98.9 F (37.2 C)   Resp 16   Wt 151 lb (68.5 kg)   BMI 26.96 kg/m  Wt Readings from Last 3 Encounters:  10/14/15 151 lb (68.5 kg)  04/29/15 153 lb 6 oz (69.6 kg)  03/14/14 148 lb 8 oz (67.4 kg)     Lab Results  Component Value Date   WBC 9.3 05/06/2015   HGB 13.9 05/06/2015   HCT 42.2 05/06/2015  PLT 374.0 05/06/2015   GLUCOSE 93 05/06/2015   CHOL 177 05/06/2015   TRIG 71.0 05/06/2015   HDL 71.00 05/06/2015   LDLCALC 92 05/06/2015   ALT 16 05/06/2015   AST 20 05/06/2015   NA 140 05/06/2015   K 4.3 05/06/2015   CL 103 05/06/2015   CREATININE 0.74 05/06/2015   BUN 12 05/06/2015   CO2 26 05/06/2015   TSH 2.07 05/06/2015    Mm Digital Screening Bilateral  Result Date: 05/07/2015 CLINICAL DATA:  Screening. EXAM: DIGITAL SCREENING BILATERAL MAMMOGRAM WITH CAD COMPARISON:  Previous exam(s). ACR Breast Density Category b: There are scattered areas of fibroglandular density. FINDINGS: There are no findings suspicious for malignancy. Images were processed with CAD. IMPRESSION: No mammographic evidence of malignancy. A result letter of  this screening mammogram will be mailed directly to the patient. RECOMMENDATION: Screening mammogram in one year. (Code:SM-B-01Y) BI-RADS CATEGORY  1: Negative. Electronically Signed   By: Lillia Mountain M.D.   On: 05/07/2015 12:24       Assessment & Plan:   Problem List Items Addressed This Visit    HPV test positive    Previous PAP - positive HPV.  Repeat pap today.  Follow.         Other Visit Diagnoses   None.      Einar Pheasant, MD

## 2015-10-14 NOTE — Assessment & Plan Note (Signed)
Previous PAP - positive HPV.  Repeat pap today.  Follow.

## 2015-10-14 NOTE — Progress Notes (Signed)
Pre visit review using our clinic review tool, if applicable. No additional management support is needed unless otherwise documented below in the visit note. 

## 2015-10-14 NOTE — Addendum Note (Signed)
Addended by: Frutoso Chase A on: 10/14/2015 11:51 AM   Modules accepted: Orders

## 2015-10-16 LAB — CYTOLOGY - PAP

## 2015-11-03 ENCOUNTER — Other Ambulatory Visit: Payer: Self-pay | Admitting: Internal Medicine

## 2015-11-03 DIAGNOSIS — IMO0002 Reserved for concepts with insufficient information to code with codable children: Secondary | ICD-10-CM

## 2015-12-10 ENCOUNTER — Encounter: Payer: Self-pay | Admitting: Obstetrics and Gynecology

## 2015-12-11 ENCOUNTER — Encounter: Payer: Self-pay | Admitting: Obstetrics and Gynecology

## 2015-12-11 ENCOUNTER — Ambulatory Visit (INDEPENDENT_AMBULATORY_CARE_PROVIDER_SITE_OTHER): Payer: Managed Care, Other (non HMO) | Admitting: Obstetrics and Gynecology

## 2015-12-11 VITALS — BP 132/80 | HR 97 | Ht 62.5 in | Wt 152.6 lb

## 2015-12-11 DIAGNOSIS — B977 Papillomavirus as the cause of diseases classified elsewhere: Secondary | ICD-10-CM

## 2015-12-11 NOTE — Patient Instructions (Signed)
1. Recommend repeat Pap smear at the time of annual physical with Dr. Einar Pheasant 2. Avoid tobacco products 3. Use condoms if with no sexual partners 4. Follow-up as needed  Human Papillomavirus Human papillomavirus (HPV) is the most common sexually transmitted infection (STI) and is highly contagious. HPV infections cause genital warts and cancers to the outlet of the womb (cervix), birth canal (vagina), opening of the birth canal (vulva), and anus. There are over 100 types of HPV. Unless wartlike lesions are present in the throat or there are genital warts that you can see or feel, HPV usually does not cause symptoms. It is possible to be infected for long periods and pass it on to others without knowing it. CAUSES  HPV is spread from person to person through sexual contact. This includes oral, vaginal, or anal sex. RISK FACTORS  Having unprotected sex. HPV can be spread by oral, vaginal, or anal sex.  Having several sex partners.  Having a sex partner who has other sex partners.  Having or having had another sexually transmitted infection. SIGNS AND SYMPTOMS  Most people carrying HPV do not have any symptoms. If symptoms are present, symptoms may include:  Wartlike lesions in the throat (from having oral sex).  Warts in the infected skin or mucous membranes.  Genital warts that may itch, burn, or bleed.  Genital warts that may be painful or bleed during sexual intercourse. DIAGNOSIS  If wartlike lesions are present in the throat or genital warts are present, your health care provider can usually diagnose HPV by physical examination.   Genital warts are easily seen with the naked eye.  Currently, there is no FDA-approved test to detect HPV in males.  In females, a Pap test can show cells that are infected with HPV.  In females, a scope can be used to view the cervix (colposcopy). A colposcopy can be performed if the pelvic exam or Pap test is abnormal. A sample of tissue may  be removed (biopsy) during the colposcopy. TREATMENT  There is no treatment for the virus itself. However, there are treatments for the health problems and symptoms HPV can cause. Your health care provider will follow you closely after you are treated. This is because the HPV can come back and may need treatment again. Treatment of HPV may include:   Medicines, which may be injected or applied in a cream, lotion, or gel form.  Use of a probe to apply extreme cold (cryotherapy).  Application of an intense beam of light (laser treatment).  Use of a probe to apply extreme heat (electrocautery).  Surgery. HOME CARE INSTRUCTIONS   Take medicines only as directed by your health care provider.  Use over-the-counter creams for itching or irritation as directed by your health care provider.  Keep all follow-up visits as directed by your health care provider. This is important.  Do not touch or scratch the warts.  Do not treat genital warts with medicines used for treating hand warts.  Do not have sex while you are being treated.  Do not douche or use tampons during treatment of HPV.  Tell your sex partner about your infection because he or she may also need treatment.  If you become pregnant, tell your health care provider that you have had HPV. Your health care provider will monitor you closely during pregnancy to be sure your baby is safe.  After treatment, use condoms during sex to prevent future infections.  Have only one sex partner.  Have  a sex partner who does not have other sex partners. PREVENTION   Talk to your health care provider about getting the HPV vaccines. These vaccines prevent some HPV infections and cancers. It is recommended that the vaccine be given to males and females between the ages of 102 and 53 years old. It will not work if you already have HPV, and it is not recommended for pregnant women.  A Pap test is done to screen for cervical cancer in women.  The  first Pap test should be done at age 56 years.  Between ages 81 and 22 years, Pap tests are repeated every 2 years.  Beginning at age 59, you are advised to have a Pap test every 3 years as long as your past 3 Pap tests have been normal.  Some women have medical problems that increase the chance of getting cervical cancer. Talk to your health care provider about these problems. It is especially important to talk to your health care provider if a new problem develops soon after your last Pap test. In these cases, your health care provider may recommend more frequent screening and Pap tests.  The above recommendations are the same for women who have or have not gotten the vaccine for HPV.  If you had a hysterectomy for a problem that was not a cancer or a condition that could lead to cancer, then you no longer need Pap tests. However, even if you no longer need a Pap test, a regular exam is a good idea to make sure no other problems are starting.   If you are between the ages of 42 and 47 years and you have had normal Pap tests going back 10 years, you no longer need Pap tests. However, even if you no longer need a Pap test, a regular exam is a good idea to make sure no other problems are starting.  If you have had past treatment for cervical cancer or a condition that could lead to cancer, you need Pap tests and screening for cancer for at least 20 years after your treatment.  If Pap tests have been discontinued, risk factors (such as a new sexual partner)need to be reassessed to determine if screening should be resumed.  Some women may need screenings more often if they are at high risk for cervical cancer. SEEK MEDICAL CARE IF:   The treated skin becomes red, swollen, or painful.  You have a fever.  You feel generally ill.  You feel lumps or pimple-like projections in and around your genital area.  You develop bleeding of the vagina or the treatment area.  You have painful sexual  intercourse. MAKE SURE YOU:   Understand these instructions.  Will watch your condition.  Will get help if you are not doing well or get worse.   This information is not intended to replace advice given to you by your health care provider. Make sure you discuss any questions you have with your health care provider.   Document Released: 05/08/2003 Document Revised: 03/08/2014 Document Reviewed: 05/23/2013 Elsevier Interactive Patient Education [employer].

## 2015-12-11 NOTE — Progress Notes (Signed)
GYN ENCOUNTER NOTE  Subjective:       Autumn Wolfe is a 60 y.o. G72P1001 female is here for gynecologic evaluation of the following issues:  1. HPV on Pap smear.     60 year old female, monogamous, menopausal, nonsmoker, presents from Dr. Einar Pheasant for evaluation of high-risk HPV found on Pap smear. 10/14/2015 Pap smear negative/negative 04/29/2015 Pap smear negative/positive  No postcoital bleeding. No postmenopausal bleeding. Nonsmoker Monogamous 10 years   Gynecologic History No LMP recorded. Patient is postmenopausal. Contraception: post menopausal status Last Pap: 10/14/2015 negative/negative; 04/29/2015 negative/positive  Obstetric History OB History  Gravida Para Term Preterm AB Living  1 1 1     1   SAB TAB Ectopic Multiple Live Births          1    # Outcome Date GA Lbr Len/2nd Weight Sex Delivery Anes PTL Lv  1 Term 1986   5 lb 1.8 oz (2.318 kg) F Vag-Spont   LIV      Past Medical History:  Diagnosis Date  . 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 Osteoporosis   . Vitamin D deficiency     Past Surgical History:  Procedure Laterality Date  . BREAST EXCISIONAL BIOPSY Left 1989   benign  . BREAST SURGERY  1989   left breast biopsy  . TUBAL LIGATION      Current Outpatient Prescriptions on File Prior to Visit  Medication Sig Dispense Refill  . Multiple Vitamins-Minerals (MULTIVITAMIN PO) Take by mouth.    Marland Kitchen VITAMIN D, CHOLECALCIFEROL, PO Take by mouth.     No current facility-administered medications on file prior to visit.     No Known Allergies  Social History   Social History  . Marital status: Married    Spouse name: N/A  . Number of children: 1  . Years of education: N/A   Occupational History  . Not on file.   Social History Main Topics  . Smoking status: Never Smoker  . Smokeless tobacco: Never Used  . Alcohol use 0.0 oz/week     Comment: occasional  . Drug use: No  . Sexual activity: Yes    Birth control/  protection: Surgical   Other Topics Concern  . Not on file   Social History Narrative  . No narrative on file    Family History  Problem Relation Age of Onset  . Breast cancer Mother 72    died age 39  . Parkinson's disease Father   . Lymphoma Maternal Grandmother     non-hodgkin's  . Ovarian cancer Neg Hx   . Diabetes Neg Hx   . Colon cancer Neg Hx   . Heart disease Neg Hx     The following portions of the patient's history were reviewed and updated as appropriate: allergies, current medications, past family history, past medical history, past social history, past surgical history and problem list.  Review of Systems Review of Systems - per history of present illness  Objective:   BP 132/80   Pulse 97   Ht 5' 2.5" (1.588 m)   Wt 152 lb 9.6 oz (69.2 kg)   BMI 27.47 kg/m  CONSTITUTIONAL: Well-developed, well-nourished female in no acute distress.  HENT:  Normocephalic, atraumatic.  NECK: Normal range of motion, supple, no masses.  Normal thyroid.  SKIN: Skin is warm and dry. No rash noted. Not diaphoretic. No erythema. No pallor. [place]: Alert and oriented to person, place, and time. PSYCHIATRIC: Normal mood and affect. Normal behavior. Normal  judgment and thought content. CARDIOVASCULAR:Not Examined RESPIRATORY: Not Examined BREASTS: Not Examined ABDOMEN: Soft, non distended; Non tender.  No Organomegaly. PELVIC:  External Genitalia: Normal  BUS: Normal  Vagina: Moderate atrophy  Cervix: Normal; no lesions  Uterus: Not examined  Adnexa: Not examined  RV: Normal external exam  Bladder: Nontender MUSCULOSKELETAL: Normal range of motion. No tenderness.  No cyanosis, clubbing, or edema.  PROCEDURE: Colposcopy of upper adjacent vagina and cervix Indications: History of high risk HPV on Pap smear Findings: Normal cervix and upper vagina Biopsies: None Description: Patient is placed in the dorsal lithotomy position. Speculum is inserted into the vagina. Cervix  is painted with acetic acid solution. Colposcopy was used to assess the cervix and adjacent vagina. Colposcopy is adequate. No abnormalities are seen. Inspection of all the also demonstrates no abnormalities. Procedure was well-tolerated    Assessment:   1. HPV in female  Most recent Pap smear is negative for HPV; previous Pap smear was positive for high risk HPV; suspect patient has cleared the infection. She is a nonsmoker. She is in a monogamous relationship. Colposcopy today is normal with no evidence of HPV infection     Plan:   1.Colposcopy of cervix and upper adjacent vagina 2. Avoid tobacco products 3. Recommend condoms with any new partners 4. Repeat Pap smear with HPV testing at next annual exam with Dr. Einar Pheasant 5. Follow up here when necessary  [name], MD  Note: This dictation was prepared with Dragon dictation along with smaller phrase technology. Any transcriptional errors that result from this process are unintentional.

## 2016-04-28 ENCOUNTER — Encounter: Payer: Self-pay | Admitting: Internal Medicine

## 2016-04-28 ENCOUNTER — Other Ambulatory Visit (HOSPITAL_COMMUNITY)
Admission: RE | Admit: 2016-04-28 | Discharge: 2016-04-28 | Disposition: A | Discharge status: Home / Self Care | Payer: Managed Care, Other (non HMO) | Source: Ambulatory Visit | Attending: Internal Medicine | Admitting: Internal Medicine

## 2016-04-28 ENCOUNTER — Ambulatory Visit (INDEPENDENT_AMBULATORY_CARE_PROVIDER_SITE_OTHER): Payer: Managed Care, Other (non HMO) | Admitting: Internal Medicine

## 2016-04-28 VITALS — BP 120/76 | HR 93 | Temp 98.7°F | Resp 16 | Ht 63.0 in | Wt 150.4 lb

## 2016-04-28 DIAGNOSIS — E559 Vitamin D deficiency, unspecified: Secondary | ICD-10-CM

## 2016-04-28 DIAGNOSIS — Z1151 Encounter for screening for human papillomavirus (HPV): Secondary | ICD-10-CM | POA: Insufficient documentation

## 2016-04-28 DIAGNOSIS — Z124 Encounter for screening for malignant neoplasm of cervix: Secondary | ICD-10-CM | POA: Diagnosis not present

## 2016-04-28 DIAGNOSIS — Z0000 Encounter for general adult medical examination without abnormal findings: Secondary | ICD-10-CM

## 2016-04-28 DIAGNOSIS — Z1211 Encounter for screening for malignant neoplasm of colon: Secondary | ICD-10-CM

## 2016-04-28 DIAGNOSIS — Z01419 Encounter for gynecological examination (general) (routine) without abnormal findings: Secondary | ICD-10-CM | POA: Diagnosis present

## 2016-04-28 DIAGNOSIS — Z1239 Encounter for other screening for malignant neoplasm of breast: Secondary | ICD-10-CM

## 2016-04-28 DIAGNOSIS — Z1231 Encounter for screening mammogram for malignant neoplasm of breast: Secondary | ICD-10-CM

## 2016-04-28 DIAGNOSIS — Z13220 Encounter for screening for lipoid disorders: Secondary | ICD-10-CM | POA: Diagnosis not present

## 2016-04-28 DIAGNOSIS — M810 Age-related osteoporosis without current pathological fracture: Secondary | ICD-10-CM | POA: Diagnosis not present

## 2016-04-28 LAB — CBC WITH DIFFERENTIAL/PLATELET
BASOS PCT: 1 % (ref 0.0–3.0)
Basophils Absolute: 0.1 10*3/uL (ref 0.0–0.1)
Eosinophils Absolute: 0.1 10*3/uL (ref 0.0–0.7)
Eosinophils Relative: 1.4 % (ref 0.0–5.0)
HEMATOCRIT: 41.7 % (ref 36.0–46.0)
HEMOGLOBIN: 13.8 g/dL (ref 12.0–15.0)
Lymphocytes Relative: 37.8 % (ref 12.0–46.0)
Lymphs Abs: 2.2 10*3/uL (ref 0.7–4.0)
MCHC: 33 g/dL (ref 30.0–36.0)
MCV: 91.6 fl (ref 78.0–100.0)
MONOS PCT: 8.3 % (ref 3.0–12.0)
Monocytes Absolute: 0.5 10*3/uL (ref 0.1–1.0)
Neutro Abs: 2.9 10*3/uL (ref 1.4–7.7)
Neutrophils Relative %: 51.5 % (ref 43.0–77.0)
Platelets: 340 10*3/uL (ref 150.0–400.0)
RBC: 4.55 Mil/uL (ref 3.87–5.11)
RDW: 14.3 % (ref 11.5–15.5)
WBC: 5.7 10*3/uL (ref 4.0–10.5)

## 2016-04-28 LAB — COMPREHENSIVE METABOLIC PANEL
ALBUMIN: 4.5 g/dL (ref 3.5–5.2)
ALK PHOS: 85 U/L (ref 39–117)
ALT: 17 U/L (ref 0–35)
AST: 18 U/L (ref 0–37)
BUN: 13 mg/dL (ref 6–23)
CALCIUM: 9.9 mg/dL (ref 8.4–10.5)
CO2: 30 mEq/L (ref 19–32)
Chloride: 103 mEq/L (ref 96–112)
Creatinine, Ser: 0.78 mg/dL (ref 0.40–1.20)
GFR: 79.43 mL/min (ref >60.00)
Glucose, Bld: 97 mg/dL (ref 70–99)
Potassium: 4.7 mEq/L (ref 3.5–5.1)
Sodium: 139 mEq/L (ref 135–145)
TOTAL PROTEIN: 7.3 g/dL (ref 6.0–8.3)
Total Bilirubin: 0.5 mg/dL (ref 0.2–1.2)

## 2016-04-28 LAB — LIPID PANEL
CHOL/HDL RATIO: 2
CHOLESTEROL: 203 mg/dL — AB (ref 0–200)
HDL: 81.5 mg/dL (ref >39.00)
LDL Cholesterol: 104 mg/dL — ABNORMAL HIGH (ref 0–99)
NonHDL: 121.73
TRIGLYCERIDES: 89 mg/dL (ref 0.0–149.0)
VLDL: 17.8 mg/dL (ref 0.0–40.0)

## 2016-04-28 LAB — VITAMIN D 25 HYDROXY (VIT D DEFICIENCY, FRACTURES): VITD: 34.86 ng/mL (ref 30.00–100.00)

## 2016-04-28 LAB — TSH: TSH: 1.47 u[IU]/mL (ref 0.35–4.50)

## 2016-04-28 NOTE — Assessment & Plan Note (Addendum)
Physical today 04/28/16.  PAP 04/28/16.  colonosocpy 05/2006 - two 2-3 mm polyps.  Recommended f/u colonoscopy in 10 years.  Due.  Refer to GI.  Last mammogram 05/07/15.  Schedule f/u mammogram.

## 2016-04-28 NOTE — Progress Notes (Signed)
[name], female   DOB: [birth year], 61 y.o.   MRN: [id number]   Subjective:    [name], female    DOB: 1955/06/14, 61 y.o.   MRN: [id number]  HPI  Patient here for her physical exam.  Was evaluated by gyn for abnormal pap smear.  S/p colposcopy.  Negative.  Gyn recommended f/u pap at her physical.  She is doing well.  Stays active.  Exercising 3 days per week.  No chest pain.  No sob.  No acid reflux.  No abdominal pain or cramping.  Bowels stable.     Past Medical History:  Diagnosis Date  . 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 Osteoporosis   . Vitamin D deficiency    Past Surgical History:  Procedure Laterality Date  . BREAST EXCISIONAL BIOPSY Left 1989   benign  . BREAST SURGERY  1989   left breast biopsy  . TUBAL LIGATION     Family History  Problem Relation Age of Onset  . Breast cancer Mother 65    died age 44  . Parkinson's disease Father   . Lymphoma Maternal Grandmother     non-hodgkin's  . Ovarian cancer Neg Hx   . Diabetes Neg Hx   . Colon cancer Neg Hx   . Heart disease Neg Hx    Social History   Social History  . Marital status: Married    Spouse name: N/A  . Number of children: 1  . Years of education: N/A   Social History Main Topics  . Smoking status: Never Smoker  . Smokeless tobacco: Never Used  . Alcohol use 0.0 oz/week     Comment: occasional  . Drug use: No  . Sexual activity: Yes    Birth control/ protection: Surgical   Other Topics Concern  . None   Social History Narrative  . None    Outpatient Encounter Prescriptions as of 04/28/2016  Medication Sig  . Multiple Vitamins-Minerals (MULTIVITAMIN PO) Take by mouth.  Marland Kitchen VITAMIN D, CHOLECALCIFEROL, PO Take by mouth.   No facility-administered encounter medications on file as of 04/28/2016.     Review of Systems  Constitutional: Negative for appetite change and unexpected weight change.  HENT: Negative for congestion and sinus  pressure.   Eyes: Negative for pain and visual disturbance.  Respiratory: Negative for cough, chest tightness and shortness of breath.   Cardiovascular: Negative for chest pain, palpitations and leg swelling.  Gastrointestinal: Negative for abdominal pain, diarrhea, nausea and vomiting.  Genitourinary: Negative for difficulty urinating and dysuria.  Musculoskeletal: Negative for back pain and joint swelling.  Skin: Negative for color change and rash.  Neurological: Negative for dizziness, light-headedness and headaches.  Hematological: Negative for adenopathy. Does not bruise/bleed easily.  Psychiatric/Behavioral: Negative for agitation and dysphoric mood.       Objective:    Physical Exam  Constitutional: She is oriented to person, place, and time. She appears well-developed and well-nourished. No distress.  HENT:  Nose: Nose normal.  Mouth/Throat: Oropharynx is clear and moist.  Eyes: Right eye exhibits no discharge. Left eye exhibits no discharge. No scleral icterus.  Neck: Neck supple. No thyromegaly present.  Cardiovascular: Normal rate and regular rhythm.   Pulmonary/Chest: Breath sounds normal. No accessory muscle usage. No tachypnea. No respiratory distress. She has no decreased breath sounds. She has no wheezes. She has no rhonchi. Right breast exhibits no inverted nipple, no mass, no nipple discharge and no tenderness (no  axillary adenopathy). Left breast exhibits no inverted nipple, no mass, no nipple discharge and no tenderness (no axilarry adenopathy).  Abdominal: Soft. Bowel sounds are normal. There is no tenderness.  Genitourinary:  Genitourinary Comments: Normal external genitalia.  Vaginal vault without lesions.  Cervix identified.  Pap smear performed.  Could not appreciate any adnexal masses or tenderness.    Musculoskeletal: She exhibits no edema or tenderness.  Lymphadenopathy:    She has no cervical adenopathy.  Neurological: She is alert and oriented to person,  place, and time.  Skin: Skin is warm. No rash noted. No erythema.  Psychiatric: She has a normal mood and affect. Her behavior is normal.    BP 120/76 (BP Location: Left Arm, Patient Position: Sitting, Cuff Size: Large)   Pulse 93   Temp 98.7 F (37.1 C) (Oral)   Resp 16   Ht 5\' 3"  (1.6 m)   Wt 150 lb 6.4 oz (68.2 kg)   SpO2 98%   BMI 26.64 kg/m  Wt Readings from Last 3 Encounters:  04/28/16 150 lb 6.4 oz (68.2 kg)  12/11/15 152 lb 9.6 oz (69.2 kg)  10/14/15 151 lb (68.5 kg)     Lab Results  Component Value Date   WBC 5.7 04/28/2016   HGB 13.8 04/28/2016   HCT 41.7 04/28/2016   PLT 340.0 04/28/2016   GLUCOSE 97 04/28/2016   CHOL 203 (H) 04/28/2016   TRIG 89.0 04/28/2016   HDL 81.50 04/28/2016   LDLCALC 104 (H) 04/28/2016   ALT 17 04/28/2016   AST 18 04/28/2016   NA 139 04/28/2016   K 4.7 04/28/2016   CL 103 04/28/2016   CREATININE 0.78 04/28/2016   BUN 13 04/28/2016   CO2 30 04/28/2016   TSH 1.47 04/28/2016       Assessment & Plan:   Problem List Items Addressed This Visit    Healthcare maintenance    Physical today 04/28/16.  PAP 04/28/16.  colonosocpy 05/2006 - two 2-3 mm polyps.  Recommended f/u colonoscopy in 10 years.  Due.  Refer to GI.  Last mammogram 05/07/15.  Schedule f/u mammogram.        Osteoporosis    Off fosamax.  Need to schedule f/u bone density.  Will notify me when agreeable.        Relevant Orders   CBC with Differential/Platelet (Completed)   Comprehensive metabolic panel (Completed)   TSH (Completed)   Vitamin D deficiency    Recheck vitamin D level today.       Relevant Orders   VITAMIN D 25 Hydroxy (Vit-D Deficiency, Fractures) (Completed)    Other Visit Diagnoses    Routine general medical examination at a health care facility    -  Primary   Breast cancer screening       Relevant Orders   MM DIGITAL SCREENING BILATERAL   Screening cholesterol level       Relevant Orders   Lipid panel (Completed)   Colon cancer screening        Relevant Orders   Ambulatory referral to Gastroenterology   Screening for cervical cancer       Relevant Orders   Cytology - PAP (Completed)       Einar Pheasant, MD

## 2016-04-28 NOTE — Assessment & Plan Note (Signed)
Recheck vitamin  D level today.  

## 2016-04-28 NOTE — Progress Notes (Signed)
Pre-visit discussion using our clinic review tool. No additional management support is needed unless otherwise documented below in the visit note.  

## 2016-04-28 NOTE — Assessment & Plan Note (Signed)
Off fosamax.  Need to schedule f/u bone density.  Will notify me when agreeable.

## 2016-04-29 ENCOUNTER — Encounter: Payer: Self-pay | Admitting: Internal Medicine

## 2016-04-29 LAB — CYTOLOGY - PAP
DIAGNOSIS: NEGATIVE
HPV: NOT DETECTED

## 2016-05-02 ENCOUNTER — Encounter: Payer: Self-pay | Admitting: Internal Medicine

## 2016-05-04 ENCOUNTER — Encounter: Payer: Self-pay | Admitting: Internal Medicine

## 2016-05-19 ENCOUNTER — Ambulatory Visit: Payer: Managed Care, Other (non HMO)

## 2016-05-25 ENCOUNTER — Ambulatory Visit
Admission: RE | Admit: 2016-05-25 | Discharge: 2016-05-25 | Disposition: A | Discharge status: Home / Self Care | Payer: Managed Care, Other (non HMO) | Source: Ambulatory Visit | Attending: Internal Medicine | Admitting: Internal Medicine

## 2016-05-25 DIAGNOSIS — Z1231 Encounter for screening mammogram for malignant neoplasm of breast: Secondary | ICD-10-CM | POA: Diagnosis not present

## 2016-05-25 DIAGNOSIS — Z1239 Encounter for other screening for malignant neoplasm of breast: Secondary | ICD-10-CM

## 2016-05-25 LAB — HM MAMMOGRAPHY

## 2016-08-18 LAB — HM COLONOSCOPY

## 2016-11-14 ENCOUNTER — Encounter: Payer: Self-pay | Admitting: Internal Medicine

## 2017-05-03 ENCOUNTER — Ambulatory Visit (INDEPENDENT_AMBULATORY_CARE_PROVIDER_SITE_OTHER): Payer: Managed Care, Other (non HMO) | Admitting: Internal Medicine

## 2017-05-03 ENCOUNTER — Other Ambulatory Visit (HOSPITAL_COMMUNITY)
Admission: RE | Admit: 2017-05-03 | Discharge: 2017-05-03 | Disposition: A | Discharge status: Home / Self Care | Payer: Managed Care, Other (non HMO) | Source: Ambulatory Visit | Attending: Internal Medicine | Admitting: Internal Medicine

## 2017-05-03 ENCOUNTER — Encounter: Payer: Self-pay | Admitting: Internal Medicine

## 2017-05-03 VITALS — BP 112/70 | HR 89 | Temp 98.2°F | Resp 18 | Wt 144.6 lb

## 2017-05-03 DIAGNOSIS — Z8742 Personal history of other diseases of the female genital tract: Secondary | ICD-10-CM

## 2017-05-03 DIAGNOSIS — Z0000 Encounter for general adult medical examination without abnormal findings: Secondary | ICD-10-CM

## 2017-05-03 DIAGNOSIS — Z87898 Personal history of other specified conditions: Secondary | ICD-10-CM

## 2017-05-03 DIAGNOSIS — E559 Vitamin D deficiency, unspecified: Secondary | ICD-10-CM | POA: Diagnosis not present

## 2017-05-03 DIAGNOSIS — Z1231 Encounter for screening mammogram for malignant neoplasm of breast: Secondary | ICD-10-CM

## 2017-05-03 DIAGNOSIS — Z13220 Encounter for screening for lipoid disorders: Secondary | ICD-10-CM | POA: Diagnosis not present

## 2017-05-03 DIAGNOSIS — Z124 Encounter for screening for malignant neoplasm of cervix: Secondary | ICD-10-CM

## 2017-05-03 DIAGNOSIS — Z1239 Encounter for other screening for malignant neoplasm of breast: Secondary | ICD-10-CM

## 2017-05-03 DIAGNOSIS — M810 Age-related osteoporosis without current pathological fracture: Secondary | ICD-10-CM | POA: Diagnosis not present

## 2017-05-03 NOTE — Progress Notes (Signed)
Patient ID: EMANUELLE HAMMERSTROM, female   DOB: November 15, 1955, 62 y.o.   MRN: [id number]   Subjective:    Patient ID: LIZETH BENCOSME, female    DOB: 01-Apr-1955, 62 y.o.   MRN: [id number]  HPI  Patient here for her physical exam.  She reports she is doing well.  Feels good.  Has lost weight.  Has been trying to lose weight.  Feels better.  No chest pain.  No sob.  No acid reflux.  No abdominal pain.  Bowels moving.  Going to the gym 2x/week.  Just had colonoscopy 07/2016.  Recommended f/u in five years.     Past Medical History:  Diagnosis Date  . 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 Osteoporosis   . Vitamin D deficiency    Past Surgical History:  Procedure Laterality Date  . BREAST EXCISIONAL BIOPSY Left 1989   benign  . BREAST SURGERY  1989   left breast biopsy  . TUBAL LIGATION     Family History  Problem Relation Age of Onset  . Breast cancer Mother 50       died age 86  . Parkinson's disease Father   . Lymphoma Maternal Grandmother        non-hodgkin's  . Ovarian cancer Neg Hx   . Diabetes Neg Hx   . Colon cancer Neg Hx   . Heart disease Neg Hx    Social History   Socioeconomic History  . Marital status: Married    Spouse name: None  . Number of children: 1  . Years of education: None  . Highest education level: None  Social Needs  . Financial resource strain: None  . Food insecurity - worry: None  . Food insecurity - inability: None  . Transportation needs - medical: None  . Transportation needs - non-medical: None  Occupational History  . None  Tobacco Use  . Smoking status: Never Smoker  . Smokeless tobacco: Never Used  Substance and Sexual Activity  . Alcohol use: Yes    Alcohol/week: 0.0 oz    Comment: occasional  . Drug use: No  . Sexual activity: Yes    Birth control/protection: Surgical  Other Topics Concern  . None  Social History Narrative  . None    Outpatient Encounter Medications as of 05/03/2017  Medication Sig  . Multiple  Vitamins-Minerals (MULTIVITAMIN PO) Take by mouth.  Marland Kitchen VITAMIN D, CHOLECALCIFEROL, PO Take by mouth.   No facility-administered encounter medications on file as of 05/03/2017.     Review of Systems  Constitutional: Negative for appetite change and unexpected weight change.  HENT: Negative for congestion and sinus pressure.   Eyes: Negative for pain and visual disturbance.  Respiratory: Negative for cough, chest tightness and shortness of breath.   Cardiovascular: Negative for chest pain, palpitations and leg swelling.  Gastrointestinal: Negative for abdominal pain, diarrhea, nausea and vomiting.  Genitourinary: Negative for difficulty urinating and dysuria.  Musculoskeletal: Negative for joint swelling and myalgias.  Skin: Negative for color change and rash.  Neurological: Negative for dizziness, light-headedness and headaches.  Hematological: Negative for adenopathy. Does not bruise/bleed easily.  Psychiatric/Behavioral: Negative for agitation and dysphoric mood.       Objective:     Blood pressure rechecked by me:  112/76  Physical Exam  Constitutional: She is oriented to person, place, and time. She appears well-developed and well-nourished.  HENT:  Nose: Nose normal.  Mouth/Throat: Oropharynx is clear and moist.  Eyes: Right eye exhibits no  discharge. Left eye exhibits no discharge. No scleral icterus.  Neck: Neck supple. No thyromegaly present.  Cardiovascular: Normal rate and regular rhythm.  Pulmonary/Chest: Breath sounds normal. No accessory muscle usage. No tachypnea. No respiratory distress. She has no decreased breath sounds. She has no wheezes. She has no rhonchi. Right breast exhibits no inverted nipple, no mass, no nipple discharge and no tenderness (no axillary adenopathy). Left breast exhibits no inverted nipple, no mass, no nipple discharge and no tenderness (no axilarry adenopathy).  Abdominal: Soft. Bowel sounds are normal. There is no tenderness.  Genitourinary:    Genitourinary Comments: Normal external genitalia.  Vaginal vault without lesions.  Cervix identified.  Pap smear performed.  Some atrophy changes noted.  Could not appreciate any adnexal masses or tenderness.    Musculoskeletal: She exhibits no edema or tenderness.  Lymphadenopathy:    She has no cervical adenopathy.  Neurological: She is alert and oriented to person, place, and time.  Skin: Skin is warm. No rash noted. No erythema.  Psychiatric: She has a normal mood and affect. Her behavior is normal.    BP 112/70 (BP Location: Left Arm, Patient Position: Sitting, Cuff Size: Normal)   Pulse 89   Temp 98.2 F (36.8 C) (Oral)   Resp 18   Wt 144 lb 9.6 oz (65.6 kg)   SpO2 98%   BMI 25.61 kg/m  Wt Readings from Last 3 Encounters:  05/03/17 144 lb 9.6 oz (65.6 kg)  04/28/16 150 lb 6.4 oz (68.2 kg)  12/11/15 152 lb 9.6 oz (69.2 kg)     Lab Results  Component Value Date   WBC 6.3 05/03/2017   HGB 13.5 05/03/2017   HCT 40.8 05/03/2017   PLT 383 (H) 05/03/2017   GLUCOSE 92 05/03/2017   CHOL 203 (H) 05/03/2017   TRIG 90 05/03/2017   HDL 80 05/03/2017   LDLCALC 105 (H) 05/03/2017   ALT 12 05/03/2017   AST 21 05/03/2017   NA 144 05/03/2017   K 4.7 05/03/2017   CL 104 05/03/2017   CREATININE 0.80 05/03/2017   BUN 13 05/03/2017   CO2 24 05/03/2017   TSH 2.590 05/03/2017    Mm Digital Screening Bilateral  Result Date: 05/25/2016 CLINICAL DATA:  Screening. EXAM: DIGITAL SCREENING BILATERAL MAMMOGRAM WITH CAD COMPARISON:  Previous exam(s). ACR Breast Density Category b: There are scattered areas of fibroglandular density. FINDINGS: There are no findings suspicious for malignancy. Images were processed with CAD. IMPRESSION: No mammographic evidence of malignancy. A result letter of this screening mammogram will be mailed directly to the patient. RECOMMENDATION: Screening mammogram in one year. (Code:SM-B-01Y) BI-RADS CATEGORY  1: Negative. Electronically Signed   By: Ammie Ferrier M.D.   On: 05/25/2016 13:25       Assessment & Plan:   Problem List Items Addressed This Visit    Healthcare maintenance    Physical today 05/03/17.  PAP 04/28/16 - satisfactory for evaluation.  Atrophy changes present.  Negative HPV.  Colonoscopy 08/18/16 - polyp.  Recommended f/u in 5 years.  Mammogram 05/25/16 - Birads I.  Schedule f/u mammogram.  Repeat pap 05/03/17.        Relevant Orders   CBC with Differential/Platelet (Completed)   Comprehensive metabolic panel (Completed)   TSH (Completed)   History of abnormal cervical Pap smear    Previous positive HPV.  PAP ok.  Last pap negative HPV.  Atrophy changes noted.  Repeat pap today.        Osteoporosis  Off fosamax.  Will notify me when agreeable to f/u bone density.        Vitamin D deficiency    Recheck vitamin D level.        Relevant Orders   VITAMIN D 25 Hydroxy (Vit-D Deficiency, Fractures) (Completed)    Other Visit Diagnoses    Routine general medical examination at a health care facility    -  Primary   Breast cancer screening       Relevant Orders   MM DIGITAL SCREENING BILATERAL   Screening cholesterol level       Relevant Orders   Lipid Profile (Completed)   Screening for cervical cancer       Relevant Orders   Cytology - PAP (Completed)       Einar Pheasant, MD

## 2017-05-03 NOTE — Assessment & Plan Note (Addendum)
Physical today 05/03/17.  PAP 04/28/16 - satisfactory for evaluation.  Atrophy changes present.  Negative HPV.  Colonoscopy 08/18/16 - polyp.  Recommended f/u in 5 years.  Mammogram 05/25/16 - Birads I.  Schedule f/u mammogram.  Repeat pap 05/03/17.

## 2017-05-04 LAB — COMPREHENSIVE METABOLIC PANEL
ALT: 12 IU/L (ref 0–32)
AST: 21 IU/L (ref 0–40)
Albumin/Globulin Ratio: 2.1 (ref 1.2–2.2)
Albumin: 5 g/dL — ABNORMAL HIGH (ref 3.6–4.8)
Alkaline Phosphatase: 96 IU/L (ref 39–117)
BILIRUBIN TOTAL: 0.3 mg/dL (ref 0.0–1.2)
BUN / CREAT RATIO: 16 (ref 12–28)
BUN: 13 mg/dL (ref 8–27)
CHLORIDE: 104 mmol/L (ref 96–106)
CO2: 24 mmol/L (ref 20–29)
CREATININE: 0.8 mg/dL (ref 0.57–1.00)
Calcium: 10.1 mg/dL (ref 8.7–10.3)
GFR calc Af Amer: 91 mL/min/{1.73_m2} (ref >59)
GFR calc non Af Amer: 79 mL/min/{1.73_m2} (ref >59)
Globulin, Total: 2.4 g/dL (ref 1.5–4.5)
Glucose: 92 mg/dL (ref 65–99)
Potassium: 4.7 mmol/L (ref 3.5–5.2)
Sodium: 144 mmol/L (ref 134–144)
Total Protein: 7.4 g/dL (ref 6.0–8.5)

## 2017-05-04 LAB — CBC WITH DIFFERENTIAL/PLATELET
BASOS ABS: 0 10*3/uL (ref 0.0–0.2)
Basos: 1 %
EOS (ABSOLUTE): 0.1 10*3/uL (ref 0.0–0.4)
Eos: 1 %
Hematocrit: 40.8 % (ref 34.0–46.6)
Hemoglobin: 13.5 g/dL (ref 11.1–15.9)
Immature Grans (Abs): 0 10*3/uL (ref 0.0–0.1)
Immature Granulocytes: 0 %
LYMPHS ABS: 2.4 10*3/uL (ref 0.7–3.1)
Lymphs: 38 %
MCH: 29.4 pg (ref 26.6–33.0)
MCHC: 33.1 g/dL (ref 31.5–35.7)
MCV: 89 fL (ref 79–97)
MONOS ABS: 0.6 10*3/uL (ref 0.1–0.9)
Monocytes: 9 %
NEUTROS ABS: 3.2 10*3/uL (ref 1.4–7.0)
Neutrophils: 51 %
PLATELETS: 383 10*3/uL — AB (ref 150–379)
RBC: 4.59 x10E6/uL (ref 3.77–5.28)
RDW: 13.4 % (ref 12.3–15.4)
WBC: 6.3 10*3/uL (ref 3.4–10.8)

## 2017-05-04 LAB — LIPID PANEL
CHOLESTEROL TOTAL: 203 mg/dL — AB (ref 100–199)
Chol/HDL Ratio: 2.5 ratio (ref 0.0–4.4)
HDL: 80 mg/dL (ref >39)
LDL CALC: 105 mg/dL — AB (ref 0–99)
TRIGLYCERIDES: 90 mg/dL (ref 0–149)
VLDL CHOLESTEROL CAL: 18 mg/dL (ref 5–40)

## 2017-05-04 LAB — VITAMIN D 25 HYDROXY (VIT D DEFICIENCY, FRACTURES): VIT D 25 HYDROXY: 39.6 ng/mL (ref 30.0–100.0)

## 2017-05-04 LAB — TSH: TSH: 2.59 u[IU]/mL (ref 0.450–4.500)

## 2017-05-05 ENCOUNTER — Other Ambulatory Visit: Payer: Self-pay | Admitting: Internal Medicine

## 2017-05-05 DIAGNOSIS — D75839 Thrombocytosis, unspecified: Secondary | ICD-10-CM

## 2017-05-05 DIAGNOSIS — D473 Essential (hemorrhagic) thrombocythemia: Secondary | ICD-10-CM

## 2017-05-05 DIAGNOSIS — E8809 Other disorders of plasma-protein metabolism, not elsewhere classified: Secondary | ICD-10-CM

## 2017-05-05 LAB — CYTOLOGY - PAP
DIAGNOSIS: NEGATIVE
HPV (WINDOPATH): NOT DETECTED

## 2017-05-05 NOTE — Progress Notes (Signed)
Orders placed for f/u labs.  

## 2017-05-06 ENCOUNTER — Encounter: Payer: Self-pay | Admitting: Internal Medicine

## 2017-05-06 NOTE — Assessment & Plan Note (Signed)
Off fosamax.  Will notify me when agreeable to f/u bone density.

## 2017-05-06 NOTE — Assessment & Plan Note (Signed)
Recheck vitamin D level 

## 2017-05-06 NOTE — Assessment & Plan Note (Signed)
Previous positive HPV.  PAP ok.  Last pap negative HPV.  Atrophy changes noted.  Repeat pap today.

## 2017-05-26 ENCOUNTER — Ambulatory Visit
Admission: RE | Admit: 2017-05-26 | Discharge: 2017-05-26 | Disposition: A | Discharge status: Home / Self Care | Payer: Managed Care, Other (non HMO) | Source: Ambulatory Visit | Attending: Internal Medicine | Admitting: Internal Medicine

## 2017-05-26 DIAGNOSIS — Z1239 Encounter for other screening for malignant neoplasm of breast: Secondary | ICD-10-CM

## 2017-05-26 DIAGNOSIS — Z1231 Encounter for screening mammogram for malignant neoplasm of breast: Secondary | ICD-10-CM | POA: Diagnosis present

## 2017-07-12 ENCOUNTER — Telehealth: Payer: Self-pay | Admitting: Radiology

## 2017-07-12 ENCOUNTER — Other Ambulatory Visit: Payer: Managed Care, Other (non HMO)

## 2017-07-12 DIAGNOSIS — D473 Essential (hemorrhagic) thrombocythemia: Secondary | ICD-10-CM

## 2017-07-12 DIAGNOSIS — D75839 Thrombocytosis, unspecified: Secondary | ICD-10-CM

## 2017-07-12 DIAGNOSIS — E8809 Other disorders of plasma-protein metabolism, not elsewhere classified: Secondary | ICD-10-CM

## 2017-07-12 NOTE — Telephone Encounter (Signed)
Just FYI incase results of platelet are affected.

## 2017-07-12 NOTE — Telephone Encounter (Signed)
Did she need anything or f/u for her symptoms or just FYI.  Thanks

## 2017-07-12 NOTE — Addendum Note (Signed)
Addended by: Leeanne Rio on: 07/12/2017 11:24 AM   Modules accepted: Orders

## 2017-07-12 NOTE — Addendum Note (Signed)
Addended by: Leeanne Rio on: 07/12/2017 11:22 AM   Modules accepted: Orders

## 2017-07-12 NOTE — Telephone Encounter (Signed)
Pt came in for labs today and stated that she has had a little bit of a cold and wanted to make PCP aware since platelet count is being drawn.

## 2017-07-13 ENCOUNTER — Encounter: Payer: Self-pay | Admitting: Internal Medicine

## 2017-07-13 LAB — ALBUMIN: Albumin: 4.1 g/dL (ref 3.6–4.8)

## 2017-07-13 LAB — PLATELET COUNT: Platelets: 346 10*3/uL (ref 150–379)

## 2018-04-04 ENCOUNTER — Telehealth: Payer: Self-pay | Admitting: Internal Medicine

## 2018-04-05 NOTE — Telephone Encounter (Signed)
error 

## 2018-05-08 ENCOUNTER — Encounter: Payer: Managed Care, Other (non HMO) | Admitting: Internal Medicine

## 2018-08-07 ENCOUNTER — Other Ambulatory Visit: Payer: Self-pay

## 2018-08-07 ENCOUNTER — Encounter: Payer: Self-pay | Admitting: Internal Medicine

## 2018-08-07 ENCOUNTER — Ambulatory Visit (INDEPENDENT_AMBULATORY_CARE_PROVIDER_SITE_OTHER): Payer: Managed Care, Other (non HMO) | Admitting: Internal Medicine

## 2018-08-07 ENCOUNTER — Other Ambulatory Visit (HOSPITAL_COMMUNITY)
Admission: RE | Admit: 2018-08-07 | Discharge: 2018-08-07 | Disposition: A | Discharge status: Home / Self Care | Payer: Managed Care, Other (non HMO) | Source: Ambulatory Visit | Attending: Internal Medicine | Admitting: Internal Medicine

## 2018-08-07 VITALS — BP 114/70 | HR 91 | Temp 98.9°F | Resp 16 | Wt 148.6 lb

## 2018-08-07 DIAGNOSIS — Z0000 Encounter for general adult medical examination without abnormal findings: Secondary | ICD-10-CM | POA: Diagnosis not present

## 2018-08-07 DIAGNOSIS — Z124 Encounter for screening for malignant neoplasm of cervix: Secondary | ICD-10-CM | POA: Diagnosis not present

## 2018-08-07 DIAGNOSIS — Z8742 Personal history of other diseases of the female genital tract: Secondary | ICD-10-CM | POA: Diagnosis not present

## 2018-08-07 DIAGNOSIS — Z13220 Encounter for screening for lipoid disorders: Secondary | ICD-10-CM

## 2018-08-07 DIAGNOSIS — Z1239 Encounter for other screening for malignant neoplasm of breast: Secondary | ICD-10-CM | POA: Diagnosis not present

## 2018-08-07 DIAGNOSIS — F439 Reaction to severe stress, unspecified: Secondary | ICD-10-CM

## 2018-08-07 DIAGNOSIS — E559 Vitamin D deficiency, unspecified: Secondary | ICD-10-CM

## 2018-08-07 NOTE — Assessment & Plan Note (Addendum)
Physical today 08/07/18.  PAP 05/03/17 - negative with negative HPV.  Colonoscopy 07/2016 - polyp.  Recommended f/u in 5 years.  Mammogram 05/26/17 - Birads I.  Schedule mammogram.

## 2018-08-07 NOTE — Assessment & Plan Note (Signed)
Repeat pap today per pt request.

## 2018-08-07 NOTE — Progress Notes (Signed)
[name], female   DOB: [birth year], 63 y.o.   MRN: [id number]   Subjective:    [name], female    DOB: June 01, 1955, 63 y.o.   MRN: [id number]  HPI  Patient here for her physical exam.  She reports she is doing relatively well.  Increased stress with COVID and other "issues going on in the world".  Discussed with her today.  Overall she feels she is handling things relatively well.  Not exercising regularly, but she is getting out.  No chest pain.  No sob.  No allergy problems.  No acid reflux.  No abdominal pain.  Bowels moving.     Past Medical History:  Diagnosis Date  . 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 Osteoporosis   . Vitamin D deficiency    Past Surgical History:  Procedure Laterality Date  . BREAST EXCISIONAL BIOPSY Left 1989   benign  . BREAST SURGERY  1989   left breast biopsy  . TUBAL LIGATION     Family History  Problem Relation Age of Onset  . Breast cancer Mother 50       died age 65  . Parkinson's disease Father   . Lymphoma Maternal Grandmother        non-hodgkin's  . Ovarian cancer Neg Hx   . Diabetes Neg Hx   . Colon cancer Neg Hx   . Heart disease Neg Hx    Social History   Socioeconomic History  . Marital status: Married    Spouse name: Not on file  . Number of children: 1  . Years of education: Not on file  . Highest education level: Not on file  Occupational History  . Not on file  Social Needs  . Financial resource strain: Not on file  . Food insecurity    Worry: Not on file    Inability: Not on file  . Transportation needs    Medical: Not on file    Non-medical: Not on file  Tobacco Use  . Smoking status: Never Smoker  . Smokeless tobacco: Never Used  Substance and Sexual Activity  . Alcohol use: Yes    Alcohol/week: 0.0 standard drinks    Comment: occasional  . Drug use: No  . Sexual activity: Yes    Birth control/protection: Surgical  Lifestyle  . Physical activity    Days  per week: Not on file    Minutes per session: Not on file  . Stress: Not on file  Relationships  . Social Herbalist on phone: Not on file    Gets together: Not on file    Attends religious service: Not on file    Active member of club or organization: Not on file    Attends meetings of clubs or organizations: Not on file    Relationship status: Not on file  Other Topics Concern  . Not on file  Social History Narrative  . Not on file    Outpatient Encounter Medications as of 08/07/2018  Medication Sig  . Multiple Vitamins-Minerals (MULTIVITAMIN PO) Take by mouth.  Marland Kitchen VITAMIN D, CHOLECALCIFEROL, PO Take by mouth.   No facility-administered encounter medications on file as of 08/07/2018.     Review of Systems  Constitutional: Negative for appetite change and unexpected weight change.  HENT: Negative for congestion and sinus pressure.   Eyes: Negative for pain and visual disturbance.  Respiratory: Negative for cough, chest tightness and shortness of breath.  Cardiovascular: Negative for chest pain, palpitations and leg swelling.  Gastrointestinal: Negative for abdominal pain, diarrhea, nausea and vomiting.  Genitourinary: Negative for difficulty urinating and dysuria.  Musculoskeletal: Negative for joint swelling.  Skin: Negative for color change and rash.  Neurological: Negative for dizziness, light-headedness and headaches.  Hematological: Negative for adenopathy. Does not bruise/bleed easily.  Psychiatric/Behavioral: Negative for agitation and dysphoric mood.       Objective:    Physical Exam Constitutional:      General: She is not in acute distress.    Appearance: Normal appearance. She is well-developed.  HENT:     Right Ear: External ear normal.     Left Ear: External ear normal.  Eyes:     General: No scleral icterus.       Right eye: No discharge.        Left eye: No discharge.     Conjunctiva/sclera: Conjunctivae normal.  Neck:     Musculoskeletal:  Neck supple. No muscular tenderness.     Thyroid: No thyromegaly.  Cardiovascular:     Rate and Rhythm: Normal rate and regular rhythm.  Pulmonary:     Effort: No tachypnea, accessory muscle usage or respiratory distress.     Breath sounds: Normal breath sounds. No decreased breath sounds or wheezing.  Chest:     Breasts:        Right: No inverted nipple, mass, nipple discharge or tenderness (no axillary adenopathy).        Left: No inverted nipple, mass, nipple discharge or tenderness (no axilarry adenopathy).  Abdominal:     General: Bowel sounds are normal.     Palpations: Abdomen is soft.     Tenderness: There is no abdominal tenderness.  Genitourinary:    Comments: Normal external genitalia.  Vaginal vault without lesions.  Cervix identified.  Pap smear performed.  Could not appreciate any adnexal masses or tenderness.   Musculoskeletal:        General: No swelling or tenderness.  Lymphadenopathy:     Cervical: No cervical adenopathy.  Skin:    Findings: No erythema or rash.  Neurological:     Mental Status: She is alert and oriented to person, place, and time.  Psychiatric:        Mood and Affect: Mood normal.        Behavior: Behavior normal.     BP 114/70   Pulse 91   Temp 98.9 F (37.2 C) (Oral)   Resp 16   Wt 148 lb 9.6 oz (67.4 kg)   SpO2 98%   BMI 26.32 kg/m  Wt Readings from Last 3 Encounters:  08/07/18 148 lb 9.6 oz (67.4 kg)  05/03/17 144 lb 9.6 oz (65.6 kg)  04/28/16 150 lb 6.4 oz (68.2 kg)     Lab Results  Component Value Date   WBC 7.4 08/07/2018   HGB 13.5 08/07/2018   HCT 39.9 08/07/2018   PLT 326 08/07/2018   GLUCOSE 100 (H) 08/07/2018   CHOL 189 08/07/2018   TRIG 90 08/07/2018   HDL 79 08/07/2018   LDLCALC 92 08/07/2018   ALT 17 08/07/2018   AST 22 08/07/2018   NA 143 08/07/2018   K 4.9 08/07/2018   CL 105 08/07/2018   CREATININE 0.86 08/07/2018   BUN 13 08/07/2018   CO2 24 08/07/2018   TSH 1.560 08/07/2018    Mm Screening  Breast Tomo Bilateral  Result Date: 05/26/2017 CLINICAL DATA:  Screening. EXAM: DIGITAL SCREENING BILATERAL MAMMOGRAM WITH TOMO AND  CAD COMPARISON:  Previous exam(s). ACR Breast Density Category b: There are scattered areas of fibroglandular density. FINDINGS: There are no findings suspicious for malignancy. Images were processed with CAD. IMPRESSION: No mammographic evidence of malignancy. A result letter of this screening mammogram will be mailed directly to the patient. RECOMMENDATION: Screening mammogram in one year. (Code:SM-B-01Y) BI-RADS CATEGORY  1: Negative. Electronically Signed   By: Ammie Ferrier M.D.   On: 05/26/2017 09:45       Assessment & Plan:   Problem List Items Addressed This Visit    Healthcare maintenance    Physical today 08/07/18.  PAP 05/03/17 - negative with negative HPV.  Colonoscopy 07/2016 - polyp.  Recommended f/u in 5 years.  Mammogram 05/26/17 - Birads I.  Schedule mammogram.        Relevant Orders   CBC w/Diff (Completed)   Comp Met (CMET) (Completed)   TSH (Completed)   History of abnormal cervical Pap smear    Repeat pap today per pt request.        Stress    Increased stress as outlined.  Discussed with her today.  Overall she feels she is doing relatively well.  Does not feel needs anything more at this time.  Follow.        Vitamin D deficiency    Follow vitamin D level.        Other Visit Diagnoses    Routine general medical examination at a health care facility    -  Primary   Breast cancer screening       Relevant Orders   MM 3D SCREEN BREAST BILATERAL   Cervical cancer screening       Relevant Orders   Cytology - PAP( [place]) (Completed)   Screening cholesterol level       Relevant Orders   Lipid panel (Completed)       Einar Pheasant, MD

## 2018-08-08 LAB — COMPREHENSIVE METABOLIC PANEL
ALT: 17 IU/L (ref 0–32)
AST: 22 IU/L (ref 0–40)
Albumin/Globulin Ratio: 1.9 (ref 1.2–2.2)
Albumin: 4.9 g/dL — ABNORMAL HIGH (ref 3.8–4.8)
Alkaline Phosphatase: 105 IU/L (ref 39–117)
BUN/Creatinine Ratio: 15 (ref 12–28)
BUN: 13 mg/dL (ref 8–27)
Bilirubin Total: 0.3 mg/dL (ref 0.0–1.2)
CO2: 24 mmol/L (ref 20–29)
Calcium: 10 mg/dL (ref 8.7–10.3)
Chloride: 105 mmol/L (ref 96–106)
Creatinine, Ser: 0.86 mg/dL (ref 0.57–1.00)
GFR calc Af Amer: 83 mL/min/{1.73_m2} (ref >59)
GFR calc non Af Amer: 72 mL/min/{1.73_m2} (ref >59)
Globulin, Total: 2.6 g/dL (ref 1.5–4.5)
Glucose: 100 mg/dL — ABNORMAL HIGH (ref 65–99)
Potassium: 4.9 mmol/L (ref 3.5–5.2)
Sodium: 143 mmol/L (ref 134–144)
Total Protein: 7.5 g/dL (ref 6.0–8.5)

## 2018-08-08 LAB — CYTOLOGY - PAP
Diagnosis: NEGATIVE
HPV: NOT DETECTED

## 2018-08-08 LAB — TSH: TSH: 1.56 u[IU]/mL (ref 0.450–4.500)

## 2018-08-08 LAB — CBC WITH DIFFERENTIAL/PLATELET
Basophils Absolute: 0.1 10*3/uL (ref 0.0–0.2)
Basos: 1 %
EOS (ABSOLUTE): 0.1 10*3/uL (ref 0.0–0.4)
Eos: 1 %
Hematocrit: 39.9 % (ref 34.0–46.6)
Hemoglobin: 13.5 g/dL (ref 11.1–15.9)
Immature Grans (Abs): 0 10*3/uL (ref 0.0–0.1)
Immature Granulocytes: 0 %
Lymphocytes Absolute: 2.6 10*3/uL (ref 0.7–3.1)
Lymphs: 35 %
MCH: 30.1 pg (ref 26.6–33.0)
MCHC: 33.8 g/dL (ref 31.5–35.7)
MCV: 89 fL (ref 79–97)
Monocytes Absolute: 0.6 10*3/uL (ref 0.1–0.9)
Monocytes: 9 %
Neutrophils Absolute: 4 10*3/uL (ref 1.4–7.0)
Neutrophils: 54 %
Platelets: 326 10*3/uL (ref 150–450)
RBC: 4.48 x10E6/uL (ref 3.77–5.28)
RDW: 12.9 % (ref 11.7–15.4)
WBC: 7.4 10*3/uL (ref 3.4–10.8)

## 2018-08-08 LAB — LIPID PANEL
Chol/HDL Ratio: 2.4 ratio (ref 0.0–4.4)
Cholesterol, Total: 189 mg/dL (ref 100–199)
HDL: 79 mg/dL (ref >39)
LDL Calculated: 92 mg/dL (ref 0–99)
Triglycerides: 90 mg/dL (ref 0–149)
VLDL Cholesterol Cal: 18 mg/dL (ref 5–40)

## 2018-08-09 ENCOUNTER — Encounter: Payer: Self-pay | Admitting: Internal Medicine

## 2018-08-13 ENCOUNTER — Encounter: Payer: Self-pay | Admitting: Internal Medicine

## 2018-08-13 DIAGNOSIS — F439 Reaction to severe stress, unspecified: Secondary | ICD-10-CM | POA: Insufficient documentation

## 2018-08-13 NOTE — Assessment & Plan Note (Signed)
Increased stress as outlined.  Discussed with her today.  Overall she feels she is doing relatively well.  Does not feel needs anything more at this time.  Follow.

## 2018-08-13 NOTE — Assessment & Plan Note (Signed)
Follow vitamin D level.  

## 2018-09-15 ENCOUNTER — Ambulatory Visit
Admission: RE | Admit: 2018-09-15 | Discharge: 2018-09-15 | Disposition: A | Discharge status: Home / Self Care | Payer: Managed Care, Other (non HMO) | Source: Ambulatory Visit | Attending: Internal Medicine | Admitting: Internal Medicine

## 2018-09-15 ENCOUNTER — Other Ambulatory Visit: Payer: Self-pay

## 2018-09-15 DIAGNOSIS — Z1239 Encounter for other screening for malignant neoplasm of breast: Secondary | ICD-10-CM | POA: Diagnosis present

## 2018-09-15 DIAGNOSIS — Z1231 Encounter for screening mammogram for malignant neoplasm of breast: Secondary | ICD-10-CM | POA: Insufficient documentation

## 2019-06-29 ENCOUNTER — Ambulatory Visit: Payer: Medicare Other | Admitting: Nurse Practitioner

## 2019-06-29 ENCOUNTER — Other Ambulatory Visit: Payer: Self-pay

## 2019-06-29 ENCOUNTER — Other Ambulatory Visit: Payer: Self-pay | Admitting: Nurse Practitioner

## 2019-06-29 ENCOUNTER — Encounter: Payer: Self-pay | Admitting: Nurse Practitioner

## 2019-06-29 VITALS — BP 140/82 | HR 88 | Temp 97.4°F | Ht 63.0 in | Wt 149.2 lb

## 2019-06-29 DIAGNOSIS — R300 Dysuria: Secondary | ICD-10-CM | POA: Diagnosis not present

## 2019-06-29 LAB — POCT URINALYSIS DIPSTICK
Bilirubin, UA: NEGATIVE
Blood, UA: 10
Glucose, UA: NEGATIVE
Ketones, UA: 5
Leukocytes, UA: NEGATIVE
Nitrite, UA: NEGATIVE
Protein, UA: POSITIVE — AB
Spec Grav, UA: 1.025 (ref 1.010–1.025)
Urobilinogen, UA: 0.2 E.U./dL
pH, UA: 5.5 (ref 5.0–8.0)

## 2019-06-29 MED ORDER — NITROFURANTOIN MONOHYD MACRO 100 MG PO CAPS: 100.0000 mg | ORAL_CAPSULE | Freq: Two times a day (BID) | ORAL | 0 refills | Status: DC

## 2019-06-29 NOTE — Progress Notes (Signed)
Established Patient Office Visit  Subjective:  [name], female    DOB: 09-Jan-1956  Age: 64 y.o. MRN: [id number]  CC:  Chief Complaint  Patient presents with  . Acute Visit    UTI    HPI Autumn Wolfe presents for a 3 week history of pressure when she voids and some uncomfortable feeling in the urinary meatus. It burns when she voids. No fever/chills or flank pain. No n/v and she  feels ok. No vaginal  dc. She has not had a UTI in 30 years. She thinks her symptoms may be related to increased sexual activity with long-term boyfriend partner- more often than in the past. No vaginal problems, no blisters, or vaginal pain. Post menopausal. No hx of nephrolithiasis or hematuria.  Past Medical History:  Diagnosis Date  . 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 Osteoporosis   . Vitamin D deficiency     Past Surgical History:  Procedure Laterality Date  . BREAST EXCISIONAL BIOPSY Left 1989   benign  . BREAST SURGERY  1989   left breast biopsy  . TUBAL LIGATION      Family History  Problem Relation Age of Onset  . Breast cancer Mother 73       died age 68  . Parkinson's disease Father   . Lymphoma Maternal Grandmother        non-hodgkin's  . Ovarian cancer Neg Hx   . Diabetes Neg Hx   . Colon cancer Neg Hx   . Heart disease Neg Hx     Social History   Socioeconomic History  . Marital status: Married    Spouse name: Not on file  . Number of children: 1  . Years of education: Not on file  . Highest education level: Not on file  Occupational History  . Not on file  Tobacco Use  . Smoking status: Never Smoker  . Smokeless tobacco: Never Used  Substance and Sexual Activity  . Alcohol use: Yes    Alcohol/week: 0.0 standard drinks    Comment: occasional  . Drug use: No  . Sexual activity: Yes    Birth control/protection: Surgical  Other Topics Concern  . Not on file  Social History Narrative  . Not on file   Social Determinants  of Health   Financial Resource Strain:   . Difficulty of Paying Living Expenses:   Food Insecurity:   . Worried About Charity fundraiser in the Last Year:   . Arboriculturist in the Last Year:   Transportation Needs:   . Film/video editor (Medical):   Marland Kitchen Lack of Transportation (Non-Medical):   Physical Activity:   . Days of Exercise per Week:   . Minutes of Exercise per Session:   Stress:   . Feeling of Stress :   Social Connections:   . Frequency of Communication with Friends and Family:   . Frequency of Social Gatherings with Friends and Family:   . Attends Religious Services:   . Active Member of Clubs or Organizations:   . Attends Archivist Meetings:   Marland Kitchen Marital Status:   Intimate Partner Violence:   . Fear of Current or Ex-Partner:   . Emotionally Abused:   Marland Kitchen Physically Abused:   . Sexually Abused:     Outpatient Medications Prior to Visit  Medication Sig Dispense Refill  . Multiple Vitamins-Minerals (MULTIVITAMIN PO) Take by mouth.    Marland Kitchen VITAMIN D, CHOLECALCIFEROL, PO  Take by mouth.     No facility-administered medications prior to visit.    No Known Allergies  ROS pertinent positives none history of present illness otherwise negative.   Objective:    Physical Exam  Constitutional: She is oriented to person, place, and time. She appears well-developed and well-nourished.  HENT:  Head: Normocephalic and atraumatic.  Cardiovascular: Normal rate, regular rhythm and normal heart sounds.  Pulmonary/Chest: Effort normal and breath sounds normal.  Abdominal: Soft. Bowel sounds are normal. She exhibits no distension. There is no abdominal tenderness.  Musculoskeletal:        General: Normal range of motion.  Neurological: She is alert and oriented to person, place, and time.  Skin: Skin is warm and dry.  Psychiatric: She has a normal mood and affect. Her behavior is normal. Judgment and thought content normal.  Vitals reviewed.   BP 140/82 (BP  Location: Left Arm, Patient Position: Sitting, Cuff Size: Small)   Pulse 88   Temp (!) 97.4 F (36.3 C) (Skin)   Ht 5\' 3"  (1.6 m)   Wt 149 lb 3.2 oz (67.7 kg)   SpO2 99%   BMI 26.43 kg/m  Wt Readings from Last 3 Encounters:  06/29/19 149 lb 3.2 oz (67.7 kg)  08/07/18 148 lb 9.6 oz (67.4 kg)  05/03/17 144 lb 9.6 oz (65.6 kg)     Health Maintenance Due  Topic Date Due  . Hepatitis C Screening  Never done  . HIV Screening  Never done  . COVID-19 Vaccine (1) Never done  . TETANUS/TDAP  Never done  . PNA vac Low Risk Adult (1 of 2 - PCV13) Never done    There are no preventive care reminders to display for this patient.  Lab Results  Component Value Date   TSH 1.560 08/07/2018   Lab Results  Component Value Date   WBC 7.4 08/07/2018   HGB 13.5 08/07/2018   HCT 39.9 08/07/2018   MCV 89 08/07/2018   PLT 326 08/07/2018   Lab Results  Component Value Date   NA 143 08/07/2018   K 4.9 08/07/2018   CO2 24 08/07/2018   GLUCOSE 100 (H) 08/07/2018   BUN 13 08/07/2018   CREATININE 0.86 08/07/2018   BILITOT 0.3 08/07/2018   ALKPHOS 105 08/07/2018   AST 22 08/07/2018   ALT 17 08/07/2018   PROT 7.5 08/07/2018   ALBUMIN 4.9 (H) 08/07/2018   CALCIUM 10.0 08/07/2018   GFR 79.43 04/28/2016   Lab Results  Component Value Date   CHOL 189 08/07/2018   Lab Results  Component Value Date   HDL 79 08/07/2018   Lab Results  Component Value Date   LDLCALC 92 08/07/2018   Lab Results  Component Value Date   TRIG 90 08/07/2018   Lab Results  Component Value Date   CHOLHDL 2.4 08/07/2018   No results found for: HGBA1C    Assessment & Plan:   Problem List Items Addressed This Visit    None    Visit Diagnoses    Dysuria    -  Primary   Relevant Orders   POCT Urinalysis Dipstick (Completed)   Urine Culture (Completed)   Urine Microscopic Only (Completed)   Cytology - non gyn      Meds ordered this encounter  Medications  . nitrofurantoin,  macrocrystal-monohydrate, (MACROBID) 100 MG capsule    Sig: Take 1 capsule (100 mg total) by mouth 2 (two) times daily. Take with food.    Dispense:  10 capsule    Refill:  0    Order Specific Question:   Supervising Provider    Answer:   Einar Pheasant [id number]    We are sending off your urine and if it looks negative for a bladder infection, we will need to have you back for a pelvic exam and other cultures.   I have ordered a first- line antibiotic for use until we know. It takes about 3 days for the urine culture to report. Hydrate well. Avoid sexual intercourse while you are symptomatic.   Addendum: UA returns negative for bacteria, WBC or nitrites. It is positive for uric acid crystals which could indicate kidney stone grit. You need to drink a lot of water and flush out the bladder. Do you have a hx of gout? I also added on the urine check for STD screening to be sure not missing anything like that. The urine culture is NEG. She was advised to stop the Macrobid. If she agrees, I will place order for Urology consult. CPE with Dr. Nicki Reaper in June. Last PAP 08/07/18 and was normal.   Follow-up: Return if symptoms worsen or fail to improve.   This visit occurred during the SARS-CoV-2 public health emergency.  Safety protocols were in place, including screening questions prior to the visit, additional usage of staff PPE, and extensive cleaning of exam room while observing appropriate contact time as indicated for disinfecting solutions.   Denice Paradise, NP

## 2019-06-29 NOTE — Patient Instructions (Addendum)
It was very nice meeting you today.   We are sending off your urine and if it looks negative for a bladder infection, we will need to have you back for a pelvic exam and other cultures.   I have ordered a first- line antibiotic for use until we know.It takes about 3 days for the urine culture to report.  Dysuria Dysuria is pain or discomfort while urinating. The pain or discomfort may be felt in the part of your body that drains urine from the bladder (urethra) or in the surrounding tissue of the genitals. The pain may also be felt in the groin area, lower abdomen, or lower back. You may have to urinate frequently or have the sudden feeling that you have to urinate (urgency). Dysuria can affect both men and women, but it is more common in women. Dysuria can be caused by many different things, including:  Urinary tract infection.  Kidney stones or bladder stones.  Certain sexually transmitted infections (STIs), such as chlamydia.  Dehydration.  Inflammation of the tissues of the vagina.  Use of certain medicines.  Use of certain soaps or scented products that cause irritation. Follow these instructions at home: General instructions  Watch your condition for any changes.  Urinate often. Avoid holding urine for long periods of time.  After a bowel movement or urination, women should cleanse from front to back, using each tissue only once.  Urinate after sexual intercourse.  Keep all follow-up visits as told by your health care provider. This is important.  If you had any tests done to find the cause of dysuria, it is up to you to get your test results. Ask your health care provider, or the department that is doing the test, when your results will be ready. Eating and drinking   Drink enough fluid to keep your urine pale yellow.  Avoid caffeine, tea, and alcohol. They can irritate the bladder and make dysuria worse. In men, alcohol may irritate the prostate. Medicines  Take  over-the-counter and prescription medicines only as told by your health care provider.  If you were prescribed an antibiotic medicine, take it as told by your health care provider. Do not stop taking the antibiotic even if you start to feel better. Contact a health care provider if:  You have a fever.  You develop pain in your back or sides.  You have nausea or vomiting.  You have blood in your urine.  You are not urinating as often as you usually do. Get help right away if:  Your pain is severe and not relieved with medicines.  You cannot eat or drink without vomiting.  You are confused.  You have a rapid heartbeat while at rest.  You have shaking or chills.  You feel extremely weak. Summary  Dysuria is pain or discomfort while urinating. Many different conditions can lead to dysuria.  If you have dysuria, you may have to urinate frequently or have the sudden feeling that you have to urinate (urgency).  Watch your condition for any changes. Keep all follow-up visits as told by your health care provider.  Make sure that you urinate often and drink enough fluid to keep your urine pale yellow. This information is not intended to replace advice given to you by your health care provider. Make sure you discuss any questions you have with your health care provider. Document Revised: 01/28/2017 Document Reviewed: 12/02/2016 Elsevier Patient Education  [place].

## 2019-06-30 LAB — URINALYSIS, MICROSCOPIC ONLY
Bacteria, UA: NONE SEEN
Casts: NONE SEEN /lpf
RBC: NONE SEEN /hpf (ref 0–2)
WBC, UA: NONE SEEN /hpf (ref 0–5)

## 2019-06-30 LAB — URINE CULTURE
MICRO NUMBER:: 10425946
Result:: NO GROWTH
SPECIMEN QUALITY:: ADEQUATE

## 2019-07-01 LAB — URINE CULTURE: Organism ID, Bacteria: NO GROWTH

## 2019-07-02 ENCOUNTER — Telehealth: Payer: Self-pay | Admitting: Nurse Practitioner

## 2019-07-02 NOTE — Telephone Encounter (Signed)
She is feeling less burning with urination after hydrating. The urine culture returns negative. UA showed Uric acid crystals and we discussed kidney stone grit. She declines Urology referral and will cont to hydrate. She is hoping this resolves.  She has a CPE with Dr. Nicki Reaper next month and will discuss further work up with her as needed.

## 2019-07-06 LAB — SPECIMEN STATUS REPORT

## 2019-08-13 ENCOUNTER — Encounter: Payer: Managed Care, Other (non HMO) | Admitting: Internal Medicine

## 2019-08-24 ENCOUNTER — Ambulatory Visit (INDEPENDENT_AMBULATORY_CARE_PROVIDER_SITE_OTHER): Payer: Medicare Other | Admitting: Internal Medicine

## 2019-08-24 ENCOUNTER — Encounter: Payer: Self-pay | Admitting: Internal Medicine

## 2019-08-24 ENCOUNTER — Other Ambulatory Visit (HOSPITAL_COMMUNITY)
Admission: RE | Admit: 2019-08-24 | Discharge: 2019-08-24 | Disposition: A | Discharge status: Home / Self Care | Payer: Medicare Other | Source: Ambulatory Visit | Attending: Internal Medicine | Admitting: Internal Medicine

## 2019-08-24 ENCOUNTER — Other Ambulatory Visit: Payer: Self-pay

## 2019-08-24 VITALS — BP 120/76 | HR 83 | Temp 97.1°F | Resp 16 | Ht 63.0 in | Wt 148.0 lb

## 2019-08-24 DIAGNOSIS — Z1151 Encounter for screening for human papillomavirus (HPV): Secondary | ICD-10-CM | POA: Diagnosis not present

## 2019-08-24 DIAGNOSIS — Z1231 Encounter for screening mammogram for malignant neoplasm of breast: Secondary | ICD-10-CM

## 2019-08-24 DIAGNOSIS — E2839 Other primary ovarian failure: Secondary | ICD-10-CM

## 2019-08-24 DIAGNOSIS — Z8742 Personal history of other diseases of the female genital tract: Secondary | ICD-10-CM

## 2019-08-24 DIAGNOSIS — F439 Reaction to severe stress, unspecified: Secondary | ICD-10-CM | POA: Diagnosis not present

## 2019-08-24 DIAGNOSIS — R319 Hematuria, unspecified: Secondary | ICD-10-CM

## 2019-08-24 DIAGNOSIS — Z124 Encounter for screening for malignant neoplasm of cervix: Secondary | ICD-10-CM | POA: Diagnosis present

## 2019-08-24 DIAGNOSIS — Z13220 Encounter for screening for lipoid disorders: Secondary | ICD-10-CM

## 2019-08-24 DIAGNOSIS — M810 Age-related osteoporosis without current pathological fracture: Secondary | ICD-10-CM

## 2019-08-24 DIAGNOSIS — Z780 Asymptomatic menopausal state: Secondary | ICD-10-CM | POA: Diagnosis not present

## 2019-08-24 DIAGNOSIS — R300 Dysuria: Secondary | ICD-10-CM | POA: Diagnosis not present

## 2019-08-24 DIAGNOSIS — Z0000 Encounter for general adult medical examination without abnormal findings: Secondary | ICD-10-CM

## 2019-08-24 LAB — CBC WITH DIFFERENTIAL/PLATELET
Basophils Absolute: 0.1 10*3/uL (ref 0.0–0.1)
Basophils Relative: 0.8 % (ref 0.0–3.0)
Eosinophils Absolute: 0.1 10*3/uL (ref 0.0–0.7)
Eosinophils Relative: 1.6 % (ref 0.0–5.0)
HCT: 40.2 % (ref 36.0–46.0)
Hemoglobin: 13.4 g/dL (ref 12.0–15.0)
Lymphocytes Relative: 32.1 % (ref 12.0–46.0)
Lymphs Abs: 2.2 10*3/uL (ref 0.7–4.0)
MCHC: 33.3 g/dL (ref 30.0–36.0)
MCV: 91.5 fl (ref 78.0–100.0)
Monocytes Absolute: 0.6 10*3/uL (ref 0.1–1.0)
Monocytes Relative: 8.3 % (ref 3.0–12.0)
Neutro Abs: 3.9 10*3/uL (ref 1.4–7.7)
Neutrophils Relative %: 57.2 % (ref 43.0–77.0)
Platelets: 324 10*3/uL (ref 150.0–400.0)
RBC: 4.39 Mil/uL (ref 3.87–5.11)
RDW: 13.9 % (ref 11.5–15.5)
WBC: 6.8 10*3/uL (ref 4.0–10.5)

## 2019-08-24 LAB — LIPID PANEL
Cholesterol: 185 mg/dL (ref 0–200)
HDL: 69.2 mg/dL (ref >39.00)
LDL Cholesterol: 95 mg/dL (ref 0–99)
NonHDL: 116.03
Total CHOL/HDL Ratio: 3
Triglycerides: 105 mg/dL (ref 0.0–149.0)
VLDL: 21 mg/dL (ref 0.0–40.0)

## 2019-08-24 LAB — COMPREHENSIVE METABOLIC PANEL
ALT: 13 U/L (ref 0–35)
AST: 17 U/L (ref 0–37)
Albumin: 4.7 g/dL (ref 3.5–5.2)
Alkaline Phosphatase: 91 U/L (ref 39–117)
BUN: 11 mg/dL (ref 6–23)
CO2: 29 mEq/L (ref 19–32)
Calcium: 10 mg/dL (ref 8.4–10.5)
Chloride: 101 mEq/L (ref 96–112)
Creatinine, Ser: 0.77 mg/dL (ref 0.40–1.20)
GFR: 75.06 mL/min (ref >60.00)
Glucose, Bld: 96 mg/dL (ref 70–99)
Potassium: 4.3 mEq/L (ref 3.5–5.1)
Sodium: 140 mEq/L (ref 135–145)
Total Bilirubin: 0.4 mg/dL (ref 0.2–1.2)
Total Protein: 7 g/dL (ref 6.0–8.3)

## 2019-08-24 LAB — URINALYSIS, ROUTINE W REFLEX MICROSCOPIC
Bilirubin Urine: NEGATIVE
Ketones, ur: NEGATIVE
Leukocytes,Ua: NEGATIVE
Nitrite: NEGATIVE
Specific Gravity, Urine: 1.02 (ref 1.000–1.030)
Total Protein, Urine: NEGATIVE
Urine Glucose: NEGATIVE
Urobilinogen, UA: 0.2 (ref 0.0–1.0)
pH: 6.5 (ref 5.0–8.0)

## 2019-08-24 LAB — TSH: TSH: 1.69 u[IU]/mL (ref 0.35–4.50)

## 2019-08-24 MED ORDER — TRIAMCINOLONE ACETONIDE 0.1 % EX CREA: 1.0000 "application " | TOPICAL_CREAM | Freq: Two times a day (BID) | CUTANEOUS | 0 refills | Status: DC

## 2019-08-24 NOTE — Assessment & Plan Note (Signed)
Physical today 08/24/19.  PAP 08/24/19.  colonosocpy 07/2016 - recommended f/u in 5 years.  Mammogram 09/18/18 -Birads I.  Schedule f/u mammogram.

## 2019-08-24 NOTE — Patient Instructions (Addendum)
Dr Shea Evans - [facility] [address] Doralee Albino - [place]

## 2019-08-24 NOTE — Progress Notes (Signed)
[name], female   DOB: 01/09/1956, 64 y.o.   MRN: [id number]   Subjective:    [name], female    DOB: 14-Dec-1955, 64 y.o.   MRN: [id number]  HPI This visit occurred during the SARS-CoV-2 public health emergency.  Safety protocols were in place, including screening questions prior to the visit, additional usage of staff PPE, and extensive cleaning of exam room while observing appropriate contact time as indicated for disinfecting solutions.  Patient with past history of abnormal pap smear.  She comes in today to follow up on these issues as well as for a complete physical exam.  She reports increased stress.  discussed with her today.  Has good support.  Does not feel needs any further intervention.  Recent acid reflux.  Cut down her coffee.  Adjusted diet.  Symptoms improved.  Not an issue now.  No abdominal pain.  No chest pain or sob reported.  Bowels stable.     Past Medical History:  Diagnosis Date  . 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 Osteoporosis   . Vitamin D deficiency    Past Surgical History:  Procedure Laterality Date  . BREAST EXCISIONAL BIOPSY Left 1989   benign  . BREAST SURGERY  1989   left breast biopsy  . TUBAL LIGATION     Family History  Problem Relation Age of Onset  . Breast cancer Mother 69       died age 7  . Parkinson's disease Father   . Lymphoma Maternal Grandmother        non-hodgkin's  . Ovarian cancer Neg Hx   . Diabetes Neg Hx   . Colon cancer Neg Hx   . Heart disease Neg Hx    Social History   Socioeconomic History  . Marital status: Married    Spouse name: Not on file  . Number of children: 1  . Years of education: Not on file  . Highest education level: Not on file  Occupational History  . Not on file  Tobacco Use  . Smoking status: Never Smoker  . Smokeless tobacco: Never Used  Substance and Sexual Activity  . Alcohol use: Yes    Alcohol/week: 0.0 standard drinks    Comment:  occasional  . Drug use: No  . Sexual activity: Yes    Birth control/protection: Surgical  Other Topics Concern  . Not on file  Social History Narrative  . Not on file   Social Determinants of Health   Financial Resource Strain:   . Difficulty of Paying Living Expenses:   Food Insecurity:   . Worried About Charity fundraiser in the Last Year:   . Arboriculturist in the Last Year:   Transportation Needs:   . Film/video editor (Medical):   Marland Kitchen Lack of Transportation (Non-Medical):   Physical Activity:   . Days of Exercise per Week:   . Minutes of Exercise per Session:   Stress:   . Feeling of Stress :   Social Connections:   . Frequency of Communication with Friends and Family:   . Frequency of Social Gatherings with Friends and Family:   . Attends Religious Services:   . Active Member of Clubs or Organizations:   . Attends Archivist Meetings:   Marland Kitchen Marital Status:     Outpatient Encounter Medications as of 08/24/2019  Medication Sig  . Multiple Vitamins-Minerals (MULTIVITAMIN PO) Take by mouth.  . triamcinolone cream (  KENALOG) 0.1 % Apply 1 application topically 2 (two) times daily.  Marland Kitchen VITAMIN D, CHOLECALCIFEROL, PO Take by mouth.  . [DISCONTINUED] nitrofurantoin, macrocrystal-monohydrate, (MACROBID) 100 MG capsule Take 1 capsule (100 mg total) by mouth 2 (two) times daily. Take with food.   No facility-administered encounter medications on file as of 08/24/2019.    Review of Systems  Constitutional: Negative for appetite change and unexpected weight change.  HENT: Negative for congestion and sinus pressure.   Eyes: Negative for pain and visual disturbance.  Respiratory: Negative for cough, chest tightness and shortness of breath.   Cardiovascular: Negative for chest pain, palpitations and leg swelling.  Gastrointestinal: Negative for abdominal pain, diarrhea, nausea and vomiting.  Genitourinary: Negative for difficulty urinating and dysuria.    Musculoskeletal: Negative for joint swelling and myalgias.  Skin: Negative for color change and rash.  Neurological: Negative for dizziness, light-headedness and headaches.  Hematological: Negative for adenopathy. Does not bruise/bleed easily.  Psychiatric/Behavioral: Negative for agitation and dysphoric mood.       Objective:    Physical Exam Vitals reviewed.  Constitutional:      General: She is not in acute distress.    Appearance: Normal appearance. She is well-developed.  HENT:     Head: Normocephalic and atraumatic.     Right Ear: External ear normal.     Left Ear: External ear normal.  Eyes:     General: No scleral icterus.       Right eye: No discharge.        Left eye: No discharge.     Conjunctiva/sclera: Conjunctivae normal.  Neck:     Thyroid: No thyromegaly.  Cardiovascular:     Rate and Rhythm: Normal rate and regular rhythm.  Pulmonary:     Effort: No tachypnea, accessory muscle usage or respiratory distress.     Breath sounds: Normal breath sounds. No decreased breath sounds or wheezing.  Chest:     Breasts:        Right: No inverted nipple, mass, nipple discharge or tenderness (no axillary adenopathy).        Left: No inverted nipple, mass, nipple discharge or tenderness (no axilarry adenopathy).  Abdominal:     General: Bowel sounds are normal.     Palpations: Abdomen is soft.     Tenderness: There is no abdominal tenderness.  Genitourinary:    Comments: Normal external genitalia.  Vaginal vault without lesions.  Cervix identified.  Pap smear performed.  Could not appreciate any adnexal masses or tenderness.   Musculoskeletal:        General: No swelling or tenderness.     Cervical back: Neck supple. No tenderness.  Lymphadenopathy:     Cervical: No cervical adenopathy.  Skin:    Findings: No erythema or rash.  Neurological:     Mental Status: She is alert and oriented to person, place, and time.  Psychiatric:        Mood and Affect: Mood normal.         Behavior: Behavior normal.     BP 120/76   Pulse 83   Temp (!) 97.1 F (36.2 C)   Resp 16   Ht 5\' 3"  (1.6 m)   Wt 148 lb (67.1 kg)   SpO2 98%   BMI 26.22 kg/m  Wt Readings from Last 3 Encounters:  08/24/19 148 lb (67.1 kg)  06/29/19 149 lb 3.2 oz (67.7 kg)  08/07/18 148 lb 9.6 oz (67.4 kg)     Lab Results  Component Value Date   WBC 6.8 08/24/2019   HGB 13.4 08/24/2019   HCT 40.2 08/24/2019   PLT 324.0 08/24/2019   GLUCOSE 96 08/24/2019   CHOL 185 08/24/2019   TRIG 105.0 08/24/2019   HDL 69.20 08/24/2019   LDLCALC 95 08/24/2019   ALT 13 08/24/2019   AST 17 08/24/2019   NA 140 08/24/2019   K 4.3 08/24/2019   CL 101 08/24/2019   CREATININE 0.77 08/24/2019   BUN 11 08/24/2019   CO2 29 08/24/2019   TSH 1.69 08/24/2019    MM 3D SCREEN BREAST BILATERAL  Result Date: 09/18/2018 CLINICAL DATA:  Screening. EXAM: DIGITAL SCREENING BILATERAL MAMMOGRAM WITH TOMO AND CAD COMPARISON:  Previous exam(s). ACR Breast Density Category b: There are scattered areas of fibroglandular density. FINDINGS: There are no findings suspicious for malignancy. Images were processed with CAD. IMPRESSION: No mammographic evidence of malignancy. A result letter of this screening mammogram will be mailed directly to the patient. RECOMMENDATION: Screening mammogram in one year. (Code:SM-B-01Y) BI-RADS CATEGORY  1: Negative. Electronically Signed   By: Ammie Ferrier M.D.   On: 09/18/2018 10:55       Assessment & Plan:   Problem List Items Addressed This Visit    Dysuria    Noticed some dysuria.  Check urine to confirm no infection.        Relevant Orders   CBC with Differential/Platelet (Completed)   Urinalysis, Routine w reflex microscopic (Completed)   Urine Culture (Completed)   Healthcare maintenance    Physical today 08/24/19.  PAP 08/24/19.  colonosocpy 07/2016 - recommended f/u in 5 years.  Mammogram 09/18/18 -Birads I.  Schedule f/u mammogram.        History of abnormal  cervical Pap smear    08/07/18 - pap - negative with negative HPV.        Osteoporosis    Off fosamax.  Agreeable for f/u bone density.  Schedule.        Relevant Orders   Comprehensive metabolic panel (Completed)   TSH (Completed)   Stress    Increased stress as outlined.  Discussed with her today.  Does not feel needs any further intervention at this time.  Follow.         Other Visit Diagnoses    Cervical cancer screening    -  Primary   Relevant Orders   Cytology - PAP( )   Visit for screening mammogram       Relevant Orders   MM 3D SCREEN BREAST BILATERAL   Screening cholesterol level       Relevant Orders   Lipid panel (Completed)   Estrogen deficiency       Relevant Orders   DG Bone Density       Einar Pheasant, MD

## 2019-08-25 LAB — URINE CULTURE
MICRO NUMBER:: 10635400
Result:: NO GROWTH
SPECIMEN QUALITY:: ADEQUATE

## 2019-08-26 ENCOUNTER — Encounter: Payer: Self-pay | Admitting: Internal Medicine

## 2019-08-26 NOTE — Assessment & Plan Note (Signed)
08/07/18 - pap - negative with negative HPV.

## 2019-08-26 NOTE — Assessment & Plan Note (Signed)
Increased stress as outlined.  Discussed with her today.  Does not feel needs any further intervention at this time.  Follow.

## 2019-08-26 NOTE — Assessment & Plan Note (Signed)
Off fosamax.  Agreeable for f/u bone density.  Schedule.

## 2019-08-26 NOTE — Assessment & Plan Note (Signed)
Noticed some dysuria.  Check urine to confirm no infection.  

## 2019-08-27 NOTE — Addendum Note (Signed)
Addended by: Lars Masson on: 08/27/2019 01:20 PM   Modules accepted: Orders

## 2019-08-28 LAB — CYTOLOGY - PAP
Comment: NEGATIVE
Diagnosis: NEGATIVE
High risk HPV: NEGATIVE

## 2019-09-18 ENCOUNTER — Ambulatory Visit
Admission: RE | Admit: 2019-09-18 | Discharge: 2019-09-18 | Disposition: A | Discharge status: Home / Self Care | Payer: Medicare Other | Source: Ambulatory Visit | Attending: Internal Medicine | Admitting: Internal Medicine

## 2019-09-18 DIAGNOSIS — Z1231 Encounter for screening mammogram for malignant neoplasm of breast: Secondary | ICD-10-CM | POA: Diagnosis present

## 2019-09-18 DIAGNOSIS — E2839 Other primary ovarian failure: Secondary | ICD-10-CM | POA: Diagnosis present

## 2019-09-24 ENCOUNTER — Other Ambulatory Visit (INDEPENDENT_AMBULATORY_CARE_PROVIDER_SITE_OTHER): Payer: Medicare Other

## 2019-09-24 ENCOUNTER — Other Ambulatory Visit: Payer: Self-pay

## 2019-09-24 DIAGNOSIS — R319 Hematuria, unspecified: Secondary | ICD-10-CM | POA: Diagnosis not present

## 2019-09-24 LAB — URINALYSIS, ROUTINE W REFLEX MICROSCOPIC
Bilirubin Urine: NEGATIVE
Ketones, ur: NEGATIVE
Nitrite: NEGATIVE
Specific Gravity, Urine: <=1.005 — AB (ref 1.000–1.030)
Total Protein, Urine: NEGATIVE
Urine Glucose: NEGATIVE
Urobilinogen, UA: 0.2 (ref 0.0–1.0)
pH: 5 (ref 5.0–8.0)

## 2019-09-25 ENCOUNTER — Other Ambulatory Visit: Payer: Self-pay | Admitting: Internal Medicine

## 2019-09-25 ENCOUNTER — Other Ambulatory Visit: Payer: Medicare Other

## 2019-09-25 ENCOUNTER — Telehealth: Payer: Self-pay

## 2019-09-25 DIAGNOSIS — R319 Hematuria, unspecified: Secondary | ICD-10-CM

## 2019-09-25 NOTE — Telephone Encounter (Signed)
LM to call back regarding urine results.

## 2019-09-25 NOTE — Progress Notes (Signed)
Urine culture added

## 2019-09-27 LAB — URINE CULTURE
MICRO NUMBER:: 10754294
SPECIMEN QUALITY:: ADEQUATE

## 2019-09-28 ENCOUNTER — Other Ambulatory Visit: Payer: Self-pay | Admitting: Internal Medicine

## 2019-09-28 DIAGNOSIS — R319 Hematuria, unspecified: Secondary | ICD-10-CM

## 2019-09-28 NOTE — Progress Notes (Signed)
Order placed for f/u urine 

## 2019-09-29 ENCOUNTER — Telehealth: Payer: Self-pay | Admitting: Internal Medicine

## 2019-09-29 NOTE — Telephone Encounter (Signed)
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 You were right not to treat her if she's asymptomatic.   Guidelines for microscopic hematuria include ruling out infection.  They aren't clear for this particular situation.  I've definitely seen a few patients who have infected tumors and I do believe that this warrants at minimum a repeat urine and then a referral.  Caryl Pina ----- Message ----- From: Einar Pheasant, MD Sent: 09/28/2019   5:16 AM EDT To: Hollice Espy, MD Subject: question                                       Sorry to bother you.  Had a quick question and was not sure if this is a patient that you need to see.  She had a recent f/u urine to confirm no blood.  Her urinalysis reveals TNTC wbc's and 3-6 rbc's.  Cuture with 10,000-49,000 colonies - E.coli.  She is asymptomatic and has not noticed any blood in her urine.  I normally do not treat if asymptomatic.  Given rbc's, do you recommend just a f/u urine - to see if clears (or any other w/up now)?   If you need to see her, I can arrange an appt.    Thank you for your help.   Janya Eveland

## 2019-10-08 ENCOUNTER — Other Ambulatory Visit: Payer: Self-pay

## 2019-10-08 ENCOUNTER — Other Ambulatory Visit (INDEPENDENT_AMBULATORY_CARE_PROVIDER_SITE_OTHER): Payer: Medicare Other

## 2019-10-08 DIAGNOSIS — R319 Hematuria, unspecified: Secondary | ICD-10-CM | POA: Diagnosis not present

## 2019-10-08 LAB — URINALYSIS, ROUTINE W REFLEX MICROSCOPIC
Bilirubin Urine: NEGATIVE
Ketones, ur: NEGATIVE
Nitrite: NEGATIVE
Specific Gravity, Urine: 1.01 (ref 1.000–1.030)
Total Protein, Urine: NEGATIVE
Urine Glucose: NEGATIVE
Urobilinogen, UA: 0.2 (ref 0.0–1.0)
pH: 5 (ref 5.0–8.0)

## 2019-10-09 ENCOUNTER — Telehealth: Payer: Self-pay | Admitting: *Deleted

## 2019-10-09 NOTE — Telephone Encounter (Signed)
-----   Message from Einar Pheasant, MD sent at 10/09/2019  5:45 AM EDT ----- Notify pt that her urine reveals a small amount of hgb in urine, but no red blood cells.  Given recent blood and previous infection, I would like to refer her to urology to confirm no further w/up warranted.  If agreeable, let me know and I will place the order for the referral.

## 2019-10-14 ENCOUNTER — Other Ambulatory Visit: Payer: Self-pay | Admitting: Internal Medicine

## 2019-10-14 DIAGNOSIS — R319 Hematuria, unspecified: Secondary | ICD-10-CM

## 2019-10-14 DIAGNOSIS — R821 Myoglobinuria: Secondary | ICD-10-CM

## 2019-10-14 NOTE — Progress Notes (Signed)
Order placed for urology referral.  

## 2019-10-29 NOTE — Progress Notes (Signed)
10/31/2019 11:28 AM   [name] 26-Mar-1955 [id number]  Referring provider: Einar Pheasant, [address] [place],  [place] [address] Chief Complaint  Patient presents with  . Hematuria    New Patient    HPI: Autumn Wolfe is a 64 y.o. female who presents for evaluation and management of hematuria.  She had a follow up with her PCP on 08/24/2019. She noted some dysuria. UA dipstick showed trace blood. Urine culture and cytology was negative.   Routine UA on 09/24/2019 showed small blood, moderate leukocytes, >50 WBC, 3-6 RBC, mucus was present with rare bacteria. Urine culture was indicative E. Coli.   Routine UA on 10/08/2019 revealed small blood, trace leukocytes, 3-6 WBC, presence of mucus and a few bacteria.  No microscopic exam for RBC was reported on this study.    She reports that with infections she experiences some dysuria. She reports that when she has to go to the bathroom she "needs to go". She is drinking a cup of coffee daily. She likes to drink tea.   Denies history of kidney stones.   Never smoker.   PMH: Past Medical History:  Diagnosis Date  . 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 Osteoporosis   . Vitamin D deficiency     Surgical History: Past Surgical History:  Procedure Laterality Date  . BREAST EXCISIONAL BIOPSY Left 1989   benign  . BREAST SURGERY  1989   left breast biopsy  . TUBAL LIGATION      Home Medications:  Allergies as of 10/31/2019   No Known Allergies     Medication List       Accurate as of October 31, 2019 11:59 PM. If you have any questions, ask your nurse or doctor.        MULTIVITAMIN PO Take by mouth.   triamcinolone cream 0.1 % Commonly known as: KENALOG Apply 1 application topically 2 (two) times daily.   VITAMIN D (CHOLECALCIFEROL) PO Take by mouth.       Allergies: No Known Allergies  Family History: Family History  Problem Relation Age of Onset  .  Breast cancer Mother 19       died age 39  . Parkinson's disease Father   . Lymphoma Maternal Grandmother        non-hodgkin's  . Ovarian cancer Neg Hx   . Diabetes Neg Hx   . Colon cancer Neg Hx   . Heart disease Neg Hx     Social History:  reports that she has never smoked. She has never used smokeless tobacco. She reports current alcohol use. She reports that she does not use drugs.   Physical Exam: BP 126/85   Pulse 92   Ht 5\' 1"  (1.549 m)   Wt 148 lb (67.1 kg)   BMI 27.96 kg/m   Constitutional:  Alert and oriented, No acute distress. HEENT:  AT, moist mucus membranes.  Trachea midline, no masses. Cardiovascular: No clubbing, cyanosis, or edema. Respiratory: Normal respiratory effort, no increased work of breathing. Skin: No rashes, bruises or suspicious lesions. Neurologic: Grossly intact, no focal deficits, moving all 4 extremities. Psychiatric: Normal mood and affect.  Laboratory Data:  Lab Results  Component Value Date   CREATININE 0.77 08/24/2019    Urinalysis No RBCs.   Assessment & Plan:    1. Microscopic hematuria  Extensive and careful review of previous UA/ UCx as above- never any documentation of microscopic hematuria in absence of infection Patient  does not have any risk factors, thus I do not recommend any further at this time UA today shows no RBCs and is reassuring.  Recommend repeat UA in 6 month to reassess need for work up, she is agreeable with the plan and will have her PCP recheck with next labs Understands that if she does have RBC in absence of infection, will ask PCP to order "CT hematuria work up" and will go ahead and schedule cystoscopy  2. Urinary urgency  Symptoms are minimal. Discussed behavorial modifications along with potential pharmaceutical options.   Return if symptoms worsen or fail to improve.  [facility] [address] [place], [place] [address] [phone]  I, Selena Batten, am acting as a scribe for Dr. Hollice Espy.  Marland Kitchenabjsc

## 2019-10-31 ENCOUNTER — Other Ambulatory Visit: Payer: Self-pay

## 2019-10-31 ENCOUNTER — Ambulatory Visit (INDEPENDENT_AMBULATORY_CARE_PROVIDER_SITE_OTHER): Payer: Medicare Other | Admitting: Urology

## 2019-10-31 ENCOUNTER — Encounter: Payer: Self-pay | Admitting: Urology

## 2019-10-31 VITALS — BP 126/85 | HR 92 | Ht 61.0 in | Wt 148.0 lb

## 2019-10-31 DIAGNOSIS — R3129 Other microscopic hematuria: Secondary | ICD-10-CM

## 2019-10-31 DIAGNOSIS — R3915 Urgency of urination: Secondary | ICD-10-CM | POA: Diagnosis not present

## 2019-11-01 ENCOUNTER — Encounter: Payer: Self-pay | Admitting: Internal Medicine

## 2019-11-01 DIAGNOSIS — R319 Hematuria, unspecified: Secondary | ICD-10-CM | POA: Insufficient documentation

## 2019-11-01 LAB — URINALYSIS, COMPLETE
Bilirubin, UA: NEGATIVE
Glucose, UA: NEGATIVE
Ketones, UA: NEGATIVE
Nitrite, UA: NEGATIVE
Protein,UA: NEGATIVE
Specific Gravity, UA: 1.015 (ref 1.005–1.030)
Urobilinogen, Ur: 0.2 mg/dL (ref 0.2–1.0)
pH, UA: 5.5 (ref 5.0–7.5)

## 2019-11-01 LAB — MICROSCOPIC EXAMINATION: Bacteria, UA: NONE SEEN

## 2019-11-27 ENCOUNTER — Ambulatory Visit: Payer: Medicare Other | Admitting: Internal Medicine

## 2019-12-17 ENCOUNTER — Telehealth: Payer: Self-pay | Admitting: Internal Medicine

## 2019-12-17 NOTE — Telephone Encounter (Signed)
Left message for patient to call back and schedule Medicare Annual Wellness Visit (AWV)   This should be a telephone visit only=30 minutes.  NO HX; please schedule at anytime with Denisa O'Brien-Blaney at [facility]  PT STARTED [facility] 10/2018

## 2020-01-15 ENCOUNTER — Other Ambulatory Visit: Payer: Self-pay

## 2020-01-16 ENCOUNTER — Encounter: Payer: Self-pay | Admitting: Nurse Practitioner

## 2020-01-16 ENCOUNTER — Other Ambulatory Visit: Payer: Self-pay

## 2020-01-16 ENCOUNTER — Ambulatory Visit (INDEPENDENT_AMBULATORY_CARE_PROVIDER_SITE_OTHER): Payer: Medicare Other | Admitting: Nurse Practitioner

## 2020-01-16 VITALS — BP 124/80 | HR 98 | Temp 97.8°F | Resp 16 | Ht 62.0 in | Wt 149.5 lb

## 2020-01-16 DIAGNOSIS — K219 Gastro-esophageal reflux disease without esophagitis: Secondary | ICD-10-CM | POA: Insufficient documentation

## 2020-01-16 MED ORDER — PANTOPRAZOLE SODIUM 40 MG PO TBEC: 40.0000 mg | DELAYED_RELEASE_TABLET | Freq: Every day | ORAL | 3 refills | Status: DC

## 2020-01-16 NOTE — Patient Instructions (Addendum)
Please go to the lab today for an H. pylori breath test.  Please stop the famotidine. I have increased the medication to  pantoprazole 40 mg 1 pill daily 30 minutes before breakfast. This is stronger than famotidine as this is a proton pump inhibitor. This should help decrease the acid reflux burning sensation.   See the lifestyle recommendations listed below. May take Tylenol for pain- no Advil, Aleve, ibuprofen , Goody's BC- type of medications.   I have placed a referral into [facility] for further follow-up.  Please see Dr. Nicki Reaper in 1 month.   Call us back in the meantime if:  You have: ? New symptoms. ? Unexplained weight loss. ? Difficulty swallowing or it hurts to swallow. ? Wheezing or a persistent cough. ? A hoarse voice.  Your symptoms do not improve with treatment.   Get help right away if you:  Have pain in your arms, neck, jaw, teeth, or back.  Feel sweaty, dizzy, or light-headed.  Have chest pain or shortness of breath.  Vomit and your vomit looks like blood or coffee grounds.  Faint.  Have stool that is bloody or black.  Cannot swallow, drink, or eat.     Gastroesophageal Reflux Disease, Adult Gastroesophageal reflux (GER) happens when acid from the stomach flows up into the tube that connects the mouth and the stomach (esophagus). Normally, food travels down the esophagus and stays in the stomach to be digested. However, when a person has GER, food and stomach acid sometimes move back up into the esophagus. If this becomes a more serious problem, the person may be diagnosed with a disease called gastroesophageal reflux disease (GERD). GERD occurs when the reflux:  Happens often.  Causes frequent or severe symptoms.  Causes problems such as damage to the esophagus. When stomach acid comes in contact with the esophagus, the acid may cause soreness (inflammation) in the esophagus. Over time, GERD may create small holes (ulcers) in the lining of the  esophagus. What are the causes? This condition is caused by a problem with the muscle between the esophagus and the stomach (lower esophageal sphincter, or LES). Normally, the LES muscle closes after food passes through the esophagus to the stomach. When the LES is weakened or abnormal, it does not close properly, and that allows food and stomach acid to go back up into the esophagus. The LES can be weakened by certain dietary substances, medicines, and medical conditions, including:  Tobacco use.  Pregnancy.  Having a hiatal hernia.  Alcohol use.  Certain foods and beverages, such as coffee, chocolate, onions, and peppermint. What increases the risk? You are more likely to develop this condition if you:  Have an increased body weight.  Have a connective tissue disorder.  Use NSAID medicines. What are the signs or symptoms? Symptoms of this condition include:  Heartburn.  Difficult or painful swallowing.  The feeling of having a lump in the throat.  Abitter taste in the mouth.  Bad breath.  Having a large amount of saliva.  Having an upset or bloated stomach.  Belching.  Chest pain. Different conditions can cause chest pain. Make sure you see your health care provider if you experience chest pain.  Shortness of breath or wheezing.  Ongoing (chronic) cough or a night-time cough.  Wearing away of tooth enamel.  Weight loss. How is this diagnosed? Your health care provider will take a medical history and perform a physical exam. To determine if you have mild or severe GERD,  your health care provider may also monitor how you respond to treatment. You may also have tests, including:  A test to examine your stomach and esophagus with a small camera (endoscopy).  A test thatmeasures the acidity level in your esophagus.  A test thatmeasures how much pressure is on your esophagus.  A barium swallow or modified barium swallow test to show the shape, size, and  functioning of your esophagus. How is this treated? The goal of treatment is to help relieve your symptoms and to prevent complications. Treatment for this condition may vary depending on how severe your symptoms are. Your health care provider may recommend:  Changes to your diet.  Medicine.  Surgery. Follow these instructions at home: Eating and drinking   Follow a diet as recommended by your health care provider. This may involve avoiding foods and drinks such as: ? Coffee and tea (with or without caffeine). ? Drinks that containalcohol. ? Energy drinks and sports drinks. ? Carbonated drinks or sodas. ? Chocolate and cocoa. ? Peppermint and mint flavorings. ? Garlic and onions. ? Horseradish. ? Spicy and acidic foods, including peppers, chili powder, curry powder, vinegar, hot sauces, and barbecue sauce. ? Citrus fruit juices and citrus fruits, such as oranges, lemons, and limes. ? Tomato-based foods, such as red sauce, chili, salsa, and pizza with red sauce. ? Fried and fatty foods, such as donuts, french fries, potato chips, and high-fat dressings. ? High-fat meats, such as hot dogs and fatty cuts of red and white meats, such as rib eye steak, sausage, ham, and bacon. ? High-fat dairy items, such as whole milk, butter, and cream cheese.  Eat small, frequent meals instead of large meals.  Avoid drinking large amounts of liquid with your meals.  Avoid eating meals during the 2-3 hours before bedtime.  Avoid lying down right after you eat.  Do not exercise right after you eat. Lifestyle   Do not use any products that contain nicotine or tobacco, such as cigarettes, e-cigarettes, and chewing tobacco. If you need help quitting, ask your health care provider.  Try to reduce your stress by using methods such as yoga or meditation. If you need help reducing stress, ask your health care provider.  If you are overweight, reduce your weight to an amount that is healthy for you.  Ask your health care provider for guidance about a safe weight loss goal. General instructions  Pay attention to any changes in your symptoms.  Take over-the-counter and prescription medicines only as told by your health care provider. Do not take aspirin, ibuprofen, or other NSAIDs unless your health care provider told you to do so.  Wear loose-fitting clothing. Do not wear anything tight around your waist that causes pressure on your abdomen.  Raise (elevate) the head of your bed about 6 inches (15 cm).  Avoid bending over if this makes your symptoms worse.  Keep all follow-up visits as told by your health care provider. This is important. Contact a health care provider if:  You have: ? New symptoms. ? Unexplained weight loss. ? Difficulty swallowing or it hurts to swallow. ? Wheezing or a persistent cough. ? A hoarse voice.  Your symptoms do not improve with treatment. Get help right away if you:  Have pain in your arms, neck, jaw, teeth, or back.  Feel sweaty, dizzy, or light-headed.  Have chest pain or shortness of breath.  Vomit and your vomit looks like blood or coffee grounds.  Faint.  Have stool that  is bloody or black.  Cannot swallow, drink, or eat. Summary  Gastroesophageal reflux happens when acid from the stomach flows up into the esophagus. GERD is a disease in which the reflux happens often, causes frequent or severe symptoms, or causes problems such as damage to the esophagus.  Treatment for this condition may vary depending on how severe your symptoms are. Your health care provider may recommend diet and lifestyle changes, medicine, or surgery.  Contact a health care provider if you have new or worsening symptoms.  Take over-the-counter and prescription medicines only as told by your health care provider. Do not take aspirin, ibuprofen, or other NSAIDs unless your health care provider told you to do so.  Keep all follow-up visits as told by your  health care provider. This is important. This information is not intended to replace advice given to you by your health care provider. Make sure you discuss any questions you have with your health care provider. Document Revised: 08/24/2017 Document Reviewed: 08/24/2017 Elsevier Patient Education  2020 [place] for Gastroesophageal Reflux Disease, Adult When you have gastroesophageal reflux disease (GERD), the foods you eat and your eating habits are very important. Choosing the right foods can help ease the discomfort of GERD. Consider working with a diet and nutrition specialist (dietitian) to help you make healthy food choices. What general guidelines should I follow?  Eating plan  Choose healthy foods low in fat, such as fruits, vegetables, whole grains, low-fat dairy products, and lean meat, fish, and poultry.  Eat frequent, small meals instead of three large meals each day. Eat your meals slowly, in a relaxed setting. Avoid bending over or lying down until 2-3 hours after eating.  Limit high-fat foods such as fatty meats or fried foods.  Limit your intake of oils, butter, and shortening to less than 8 teaspoons each day.  Avoid the following: ? Foods that cause symptoms. These may be different for different people. Keep a food diary to keep track of foods that cause symptoms. ? Alcohol. ? Drinking large amounts of liquid with meals. ? Eating meals during the 2-3 hours before bed.  Cook foods using methods other than frying. This may include baking, grilling, or broiling. Lifestyle  Maintain a healthy weight. Ask your health care provider what weight is healthy for you. If you need to lose weight, work with your health care provider to do so safely.  Exercise for at least 30 minutes on 5 or more days each week, or as told by your health care provider.  Avoid wearing clothes that fit tightly around your waist and chest.  Do not use any products that contain  nicotine or tobacco, such as cigarettes and e-cigarettes. If you need help quitting, ask your health care provider.  Sleep with the head of your bed raised. Use a wedge under the mattress or blocks under the bed frame to raise the head of the bed. What foods are not recommended? The items listed may not be a complete list. Talk with your dietitian about what dietary choices are best for you. Grains Pastries or quick breads with added fat. [place] toast. Vegetables Deep fried vegetables. [place] fries. Any vegetables prepared with added fat. Any vegetables that cause symptoms. For some people this may include tomatoes and tomato products, chili peppers, onions and garlic, and horseradish. Fruits Any fruits prepared with added fat. Any fruits that cause symptoms. For some people this may include citrus fruits, such as oranges, grapefruit, pineapple,  and lemons. Meats and other protein foods High-fat meats, such as fatty beef or pork, hot dogs, ribs, ham, sausage, salami and bacon. Fried meat or protein, including fried fish and fried chicken. Nuts and nut butters. Dairy Whole milk and chocolate milk. Sour cream. Cream. Ice cream. Cream cheese. Milk shakes. Beverages Coffee and tea, with or without caffeine. Carbonated beverages. Sodas. Energy drinks. Fruit juice made with acidic fruits (such as orange or grapefruit). Tomato juice. Alcoholic drinks. Fats and oils Butter. Margarine. Shortening. Ghee. Sweets and desserts Chocolate and cocoa. Donuts. Seasoning and other foods Pepper. Peppermint and spearmint. Any condiments, herbs, or seasonings that cause symptoms. For some people, this may include curry, hot sauce, or vinegar-based salad dressings. Summary  When you have gastroesophageal reflux disease (GERD), food and lifestyle choices are very important to help ease the discomfort of GERD.  Eat frequent, small meals instead of three large meals each day. Eat your meals slowly, in a relaxed  setting. Avoid bending over or lying down until 2-3 hours after eating.  Limit high-fat foods such as fatty meat or fried foods. This information is not intended to replace advice given to you by your health care provider. Make sure you discuss any questions you have with your health care provider. Document Revised: 06/08/2018 Document Reviewed: 02/17/2016 Elsevier Patient Education  [place].

## 2020-01-16 NOTE — Progress Notes (Signed)
Established Patient Office Visit  Subjective:  [name], female    DOB: 10/22/1955  Age: 64 y.o. MRN: [id number]  CC:  Chief Complaint  Patient presents with  . Gastroesophageal Reflux    HPI [name] presents for 58-month history of acid reflux is bothering her every day unless she takes something for it. She has been taking famotidine 20 mg once daily. She is also taking a 2-week course of Prilosec and that resolved the symptoms. She gets  burning in her throat, and feels like there is acid there when she stops the medication. No dysphagia but 10 years ago when she was taking Fosamax, she did notice a little dysphagia. She  has found no specific triggers for HB. Very mild , intermittent nausea, no vomiting and normal diet and appetite. No change in bowel habits. No blood in stool. She does work out at [employer], and takes ibuprofen just a couple times a month, not even weekly. She has no exertional chest pain, pressure, heaviness, or tightness. No shortness of breath or DOE. Stomach acid medications totally  resolves her throat burning sensation. She does eat larger meals than she probably should, and reports she does eat higher fat than she should. There is room for dietary changes. She does not drink sodas, or alcohol. She does drink tea, has started to limit her caffeine.   Past Medical History:  Diagnosis Date  . 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 Osteoporosis   . Vitamin D deficiency     Past Surgical History:  Procedure Laterality Date  . BREAST EXCISIONAL BIOPSY Left 1989   benign  . BREAST SURGERY  1989   left breast biopsy  . TUBAL LIGATION      Family History  Problem Relation Age of Onset  . Breast cancer Mother 6       died age 30  . Parkinson's disease Father   . Lymphoma Maternal Grandmother        non-hodgkin's  . Ovarian cancer Neg Hx   . Diabetes Neg Hx   . Colon cancer Neg Hx   . Heart disease Neg Hx     Social  History   Socioeconomic History  . Marital status: Married    Spouse name: Not on file  . Number of children: 1  . Years of education: Not on file  . Highest education level: Not on file  Occupational History  . Not on file  Tobacco Use  . Smoking status: Never Smoker  . Smokeless tobacco: Never Used  Substance and Sexual Activity  . Alcohol use: Yes    Alcohol/week: 0.0 standard drinks    Comment: occasional  . Drug use: No  . Sexual activity: Yes    Birth control/protection: Surgical  Other Topics Concern  . Not on file  Social History Narrative  . Not on file   Social Determinants of Health   Financial Resource Strain:   . Difficulty of Paying Living Expenses: Not on file  Food Insecurity:   . Worried About Charity fundraiser in the Last Year: Not on file  . Ran Out of Food in the Last Year: Not on file  Transportation Needs:   . Lack of Transportation (Medical): Not on file  . Lack of Transportation (Non-Medical): Not on file  Physical Activity:   . Days of Exercise per Week: Not on file  . Minutes of Exercise per Session: Not on file  Stress:   .  Feeling of Stress : Not on file  Social Connections:   . Frequency of Communication with Friends and Family: Not on file  . Frequency of Social Gatherings with Friends and Family: Not on file  . Attends Religious Services: Not on file  . Active Member of Clubs or Organizations: Not on file  . Attends Archivist Meetings: Not on file  . Marital Status: Not on file  Intimate Partner Violence:   . Fear of Current or Ex-Partner: Not on file  . Emotionally Abused: Not on file  . Physically Abused: Not on file  . Sexually Abused: Not on file    Outpatient Medications Prior to Visit  Medication Sig Dispense Refill  . Multiple Vitamins-Minerals (MULTIVITAMIN PO) Take by mouth.    . triamcinolone cream (KENALOG) 0.1 % Apply 1 application topically 2 (two) times daily. 30 g 0  . VITAMIN D, CHOLECALCIFEROL, PO  Take by mouth.    . famotidine-calcium carbonate-magnesium hydroxide (PEPCID COMPLETE) [phone] MG chewable tablet Chew 1 tablet by mouth daily as needed.     No facility-administered medications prior to visit.    No Known Allergies  Review of Systems  Constitutional: Negative.   HENT: Negative.   Respiratory: Negative for cough and shortness of breath.   Cardiovascular: Negative.  Negative for chest pain.  Gastrointestinal: Positive for nausea. Negative for abdominal pain, blood in stool, constipation, diarrhea and vomiting.  Genitourinary: Negative.   Musculoskeletal: Negative.       Objective:    Physical Exam Vitals reviewed.  Constitutional:      Appearance: Normal appearance.  Cardiovascular:     Rate and Rhythm: Normal rate and regular rhythm.     Pulses: Normal pulses.     Heart sounds: Normal heart sounds.  Pulmonary:     Effort: Pulmonary effort is normal.     Breath sounds: Normal breath sounds.  Abdominal:     General: Bowel sounds are normal.     Palpations: Abdomen is soft.  Musculoskeletal:        General: Normal range of motion.     Cervical back: Normal range of motion.  Skin:    General: Skin is warm and dry.  Neurological:     General: No focal deficit present.     Mental Status: She is alert and oriented to person, place, and time.     BP 124/80 (BP Location: Left Arm, Patient Position: Sitting, Cuff Size: Normal)   Pulse 98   Temp 97.8 F (36.6 C) (Oral)   Resp 16   Ht 5\' 2"  (1.575 m)   Wt 149 lb 8 oz (67.8 kg)   SpO2 98%   BMI 27.34 kg/m  Wt Readings from Last 3 Encounters:  01/16/20 149 lb 8 oz (67.8 kg)  10/31/19 148 lb (67.1 kg)  08/24/19 148 lb (67.1 kg)   Pulse Readings from Last 3 Encounters:  01/16/20 98  10/31/19 92  08/24/19 83    BP Readings from Last 3 Encounters:  01/16/20 124/80  10/31/19 126/85  08/24/19 120/76    Lab Results  Component Value Date   CHOL 185 08/24/2019   HDL 69.20 08/24/2019   LDLCALC 95  08/24/2019   TRIG 105.0 08/24/2019   CHOLHDL 3 08/24/2019      Health Maintenance Due  Topic Date Due  . Hepatitis C Screening  Never done    There are no preventive care reminders to display for this patient.  Lab Results  Component Value Date  TSH 1.69 08/24/2019   Lab Results  Component Value Date   WBC 6.8 08/24/2019   HGB 13.4 08/24/2019   HCT 40.2 08/24/2019   MCV 91.5 08/24/2019   PLT 324.0 08/24/2019   Lab Results  Component Value Date   NA 140 08/24/2019   K 4.3 08/24/2019   CO2 29 08/24/2019   GLUCOSE 96 08/24/2019   BUN 11 08/24/2019   CREATININE 0.77 08/24/2019   BILITOT 0.4 08/24/2019   ALKPHOS 91 08/24/2019   AST 17 08/24/2019   ALT 13 08/24/2019   PROT 7.0 08/24/2019   ALBUMIN 4.7 08/24/2019   CALCIUM 10.0 08/24/2019   GFR 75.06 08/24/2019   Lab Results  Component Value Date   CHOL 185 08/24/2019   Lab Results  Component Value Date   HDL 69.20 08/24/2019   Lab Results  Component Value Date   LDLCALC 95 08/24/2019   Lab Results  Component Value Date   TRIG 105.0 08/24/2019   Lab Results  Component Value Date   CHOLHDL 3 08/24/2019   No results found for: HGBA1C    Assessment & Plan:   Problem List Items Addressed This Visit      Digestive   Gastroesophageal reflux disease - Primary   Relevant Medications   pantoprazole (PROTONIX) 40 MG tablet   Other Relevant Orders   H. pylori breath test   Ambulatory referral to Gastroenterology      Meds ordered this encounter  Medications  . pantoprazole (PROTONIX) 40 MG tablet    Sig: Take 1 tablet (40 mg total) by mouth daily.    Dispense:  30 tablet    Refill:  3    Order Specific Question:   Supervising Provider    Answer:   Einar Pheasant [[id number]]   Please go to the lab today for an H. pylori breath test.  Please stop the famotidine. I have increased the medication to  pantoprazole 40 mg 1 pill daily 30 minutes before breakfast. This is stronger than famotidine as this  is a proton pump inhibitor. This should help decrease the acid reflux burning sensation.   See the lifestyle recommendations listed below. May take Tylenol for pain- no Advil, Aleve, ibuprofen , Goody's BC- type of medications.   I have placed a referral into  GI for further follow-up.  Please see Dr. Nicki Reaper in 1 month.   Call us back in the meantime if:  You have: ? New symptoms. ? Unexplained weight loss. ? Difficulty swallowing or it hurts to swallow. ? Wheezing or a persistent cough. ? A hoarse voice.  Your symptoms do not improve with treatment.   Get help right away if you:  Have pain in your arms, neck, jaw, teeth, or back.  Feel sweaty, dizzy, or light-headed.  Have chest pain or shortness of breath.  Vomit and your vomit looks like blood or coffee grounds.  Faint.  Have stool that is bloody or black.  Cannot swallow, drink, or eat.  Follow-up: Return in about 4 weeks (around 02/13/2020) for Dr. Nicki Reaper.   This visit occurred during the SARS-CoV-2 public health emergency.  Safety protocols were in place, including screening questions prior to the visit, additional usage of staff PPE, and extensive cleaning of exam room while observing appropriate contact time as indicated for disinfecting solutions.   Denice Paradise, NP

## 2020-01-17 LAB — H. PYLORI BREATH TEST: H. pylori Breath Test: NOT DETECTED

## 2020-03-26 ENCOUNTER — Telehealth: Payer: Self-pay | Admitting: Internal Medicine

## 2020-03-26 NOTE — Telephone Encounter (Signed)
Left message for patient to call back and schedule Medicare Annual Wellness Visit (AWV)   This should be a telephone visit only=30 minutes.  NO HX; please schedule at anytime with Denisa O'Brien-Blaney at [facility].  PT STARTED MCR 10/2018  

## 2020-03-27 ENCOUNTER — Ambulatory Visit: Payer: Medicare Other | Admitting: Gastroenterology

## 2020-04-09 ENCOUNTER — Ambulatory Visit (INDEPENDENT_AMBULATORY_CARE_PROVIDER_SITE_OTHER): Payer: Medicare Other | Admitting: Gastroenterology

## 2020-04-09 ENCOUNTER — Other Ambulatory Visit: Payer: Self-pay

## 2020-04-09 ENCOUNTER — Encounter: Payer: Self-pay | Admitting: Gastroenterology

## 2020-04-09 VITALS — BP 146/77 | HR 90 | Temp 98.5°F | Ht 61.0 in | Wt 153.4 lb

## 2020-04-09 DIAGNOSIS — K219 Gastro-esophageal reflux disease without esophagitis: Secondary | ICD-10-CM | POA: Diagnosis not present

## 2020-04-09 MED ORDER — FAMOTIDINE 20 MG PO TABS: 20.0000 mg | ORAL_TABLET | Freq: Every day | ORAL | 0 refills | Status: DC

## 2020-04-09 NOTE — Patient Instructions (Addendum)
Stop taking Protonix and start taking Pepcid 20 MG daily. This will be sent to your pharmacy.  Please purchase a bed wedge to help with your acid reflux.   Gastroesophageal Reflux Disease, Adult  Gastroesophageal reflux (GER) happens when acid from the stomach flows up into the tube that connects the mouth and the stomach (esophagus). Normally, food travels down the esophagus and stays in the stomach to be digested. With GER, food and stomach acid sometimes move back up into the esophagus. You may have a disease called gastroesophageal reflux disease (GERD) if the reflux:  Happens often.  Causes frequent or very bad symptoms.  Causes problems such as damage to the esophagus. When this happens, the esophagus becomes sore and swollen. Over time, GERD can make small holes (ulcers) in the lining of the esophagus. What are the causes? This condition is caused by a problem with the muscle between the esophagus and the stomach. When this muscle is weak or not normal, it does not close properly to keep food and acid from coming back up from the stomach. The muscle can be weak because of:  Tobacco use.  Pregnancy.  Having a certain type of hernia (hiatal hernia).  Alcohol use.  Certain foods and drinks, such as coffee, chocolate, onions, and peppermint. What increases the risk?  Being overweight.  Having a disease that affects your connective tissue.  Taking NSAIDs, such a ibuprofen. What are the signs or symptoms?  Heartburn.  Difficult or painful swallowing.  The feeling of having a lump in the throat.  A bitter taste in the mouth.  Bad breath.  Having a lot of saliva.  Having an upset or bloated stomach.  Burping.  Chest pain. Different conditions can cause chest pain. Make sure you see your doctor if you have chest pain.  Shortness of breath or wheezing.  A long-term cough or a cough at night.  Wearing away of the surface of teeth (tooth enamel).  Weight  loss. How is this treated?  Making changes to your diet.  Taking medicine.  Having surgery. Treatment will depend on how bad your symptoms are. Follow these instructions at home: Eating and drinking  Follow a diet as told by your doctor. You may need to avoid foods and drinks such as: ? Coffee and tea, with or without caffeine. ? Drinks that contain alcohol. ? Energy drinks and sports drinks. ? Bubbly (carbonated) drinks or sodas. ? Chocolate and cocoa. ? Peppermint and mint flavorings. ? Garlic and onions. ? Horseradish. ? Spicy and acidic foods. These include peppers, chili powder, curry powder, vinegar, hot sauces, and BBQ sauce. ? Citrus fruit juices and citrus fruits, such as oranges, lemons, and limes. ? Tomato-based foods. These include red sauce, chili, salsa, and pizza with red sauce. ? Fried and fatty foods. These include donuts, french fries, potato chips, and high-fat dressings. ? High-fat meats. These include hot dogs, rib eye steak, sausage, ham, and bacon. ? High-fat dairy items, such as whole milk, butter, and cream cheese.  Eat small meals often. Avoid eating large meals.  Avoid drinking large amounts of liquid with your meals.  Avoid eating meals during the 2-3 hours before bedtime.  Avoid lying down right after you eat.  Do not exercise right after you eat.   Lifestyle  Do not smoke or use any products that contain nicotine or tobacco. If you need help quitting, ask your doctor.  Try to lower your stress. If you need help doing this, ask  your doctor.  If you are overweight, lose an amount of weight that is healthy for you. Ask your doctor about a safe weight loss goal.   General instructions  Pay attention to any changes in your symptoms.  Take over-the-counter and prescription medicines only as told by your doctor.  Do not take aspirin, ibuprofen, or other NSAIDs unless your doctor says it is okay.  Wear loose clothes. Do not wear anything tight  around your waist.  Raise (elevate) the head of your bed about 6 inches (15 cm). You may need to use a wedge to do this.  Avoid bending over if this makes your symptoms worse.  Keep all follow-up visits. Contact a doctor if:  You have new symptoms.  You lose weight and you do not know why.  You have trouble swallowing or it hurts to swallow.  You have wheezing or a cough that keeps happening.  You have a hoarse voice.  Your symptoms do not get better with treatment. Get help right away if:  You have sudden pain in your arms, neck, jaw, teeth, or back.  You suddenly feel sweaty, dizzy, or light-headed.  You have chest pain or shortness of breath.  You vomit and the vomit is green, yellow, or black, or it looks like blood or coffee grounds.  You faint.  Your poop (stool) is red, bloody, or black.  You cannot swallow, drink, or eat. These symptoms may represent a serious problem that is an emergency. Do not wait to see if the symptoms will go away. Get medical help right away. Call your local emergency services (911 in the [place] Do not drive yourself to the hospital. Summary  If a person has gastroesophageal reflux disease (GERD), food and stomach acid move back up into the esophagus and cause symptoms or problems such as damage to the esophagus.  Treatment will depend on how bad your symptoms are.  Follow a diet as told by your doctor.  Take all medicines only as told by your doctor. This information is not intended to replace advice given to you by your health care provider. Make sure you discuss any questions you have with your health care provider. Document Revised: 08/27/2019 Document Reviewed: 08/27/2019 Elsevier Patient Education  [employer].

## 2020-04-10 NOTE — Progress Notes (Signed)
[facility]ig[facility] [address]  [place]  [place], [place] [address]  Main: [phone]  Fax: [phone]   Gastroenterology Consultation  Referring Provider:     Marval Regal, NP Primary Care Physician:  Einar Pheasant, MD Reason for Consultation:    GERD        HPI:    Chief Complaint  Patient presents with  . Gastroesophageal Reflux    AUDIANNA LANDGREN is a 65 y.o. y/o female referred for consultation & management  by Dr. Einar Pheasant, MD.  Patient reports new onset of indigestion, heartburn starting about November 2021.  Prior to this she reported occasional heartburn and used to take Tums occasionally.  However, symptoms started occurring daily and patient was placed on PPI by PCP.  As long as she is taking the medication, she states symptoms are well controlled.  She states without the medication and prior to the prescription she was "miserable".  No dysphagia, nausea or vomiting.  No blood in stool, no altered bowel habits.  No family history of GI malignancy Last colonoscopy 2018 reported 3 mm polyp at the hepatic flexure that was removed.  Tortuous sigmoid colon.  Pathology report not available.  No prior upper endoscopy  Past Medical History:  Diagnosis Date  . 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 Osteoporosis   . Vitamin D deficiency     Past Surgical History:  Procedure Laterality Date  . BREAST EXCISIONAL BIOPSY Left 1989   benign  . BREAST SURGERY  1989   left breast biopsy  . TUBAL LIGATION      Prior to Admission medications   Medication Sig Start Date End Date Taking? Authorizing Provider  Ascorbic Acid (VITAMIN C) 1000 MG tablet Take 1,000 mg by mouth daily.   Yes [provider]  calcium carbonate (OS-CAL - DOSED IN MG OF ELEMENTAL CALCIUM) 1250 (500 Ca) MG tablet Take 1 tablet by mouth.   Yes [provider]  famotidine (PEPCID) 20 MG tablet Take 1 tablet (20 mg total) by mouth daily. 04/09/20  Yes  Virgel Manifold, MD  Multiple Vitamins-Minerals (MULTIVITAMIN PO) Take by mouth.   Yes [provider]  pantoprazole (PROTONIX) 40 MG tablet Take 1 tablet (40 mg total) by mouth daily. 01/16/20  Yes Marval Regal, NP  triamcinolone cream (KENALOG) 0.1 % Apply 1 application topically 2 (two) times daily. 08/24/19  Yes Einar Pheasant, MD  VITAMIN D, CHOLECALCIFEROL, PO Take by mouth.   Yes [provider]  zinc gluconate 50 MG tablet Take 50 mg by mouth daily.   Yes [provider]    Family History  Problem Relation Age of Onset  . Breast cancer Mother 37       died age 54  . Parkinson's disease Father   . Lymphoma Maternal Grandmother        non-hodgkin's  . Ovarian cancer Neg Hx   . Diabetes Neg Hx   . Colon cancer Neg Hx   . Heart disease Neg Hx      Social History   Tobacco Use  . Smoking status: Never Smoker  . Smokeless tobacco: Never Used  Substance Use Topics  . Alcohol use: Yes    Alcohol/week: 0.0 standard drinks    Comment: occasional  . Drug use: No    Allergies as of 04/09/2020  . (No Known Allergies)    Review of Systems:    All systems reviewed and negative except where noted  in HPI.   Physical Exam:  BP (!) 146/77   Pulse 90   Temp 98.5 F (36.9 C) (Oral)   Ht 5\' 1"  (1.549 m)   Wt 153 lb 6.4 oz (69.6 kg)   BMI 28.98 kg/m  No LMP recorded. Patient is postmenopausal. Psych:  Alert and cooperative. Normal mood and affect. General:   Alert,  Well-developed, well-nourished, pleasant and cooperative in NAD Head:  Normocephalic and atraumatic. Eyes:  Sclera clear, no icterus.   Conjunctiva pink. Ears:  Normal auditory acuity. Nose:  No deformity, discharge, or lesions. Mouth:  No deformity or lesions,oropharynx pink & moist. Neck:  Supple; no masses or thyromegaly. Abdomen:  Normal bowel sounds.  No bruits.  Soft, non-tender and non-distended without masses, hepatosplenomegaly or hernias noted.  No guarding or  rebound tenderness.    Msk:  Symmetrical without gross deformities. Good, equal movement & strength bilaterally. Pulses:  Normal pulses noted. Extremities:  No clubbing or edema.  No cyanosis. Neurologic:  Alert and oriented x3;  grossly normal neurologically. Skin:  Intact without significant lesions or rashes. No jaundice. Lymph Nodes:  No significant cervical adenopathy. Psych:  Alert and cooperative. Normal mood and affect.   Labs: CBC    Component Value Date/Time   WBC 6.8 08/24/2019 1001   RBC 4.39 08/24/2019 1001   HGB 13.4 08/24/2019 1001   HGB 13.5 08/07/2018 1042   HCT 40.2 08/24/2019 1001   HCT 39.9 08/07/2018 1042   PLT 324.0 08/24/2019 1001   PLT 326 08/07/2018 1042   MCV 91.5 08/24/2019 1001   MCV 89 08/07/2018 1042   MCH 30.1 08/07/2018 1042   MCHC 33.3 08/24/2019 1001   RDW 13.9 08/24/2019 1001   RDW 12.9 08/07/2018 1042   LYMPHSABS 2.2 08/24/2019 1001   LYMPHSABS 2.6 08/07/2018 1042   MONOABS 0.6 08/24/2019 1001   EOSABS 0.1 08/24/2019 1001   EOSABS 0.1 08/07/2018 1042   BASOSABS 0.1 08/24/2019 1001   BASOSABS 0.1 08/07/2018 1042   CMP     Component Value Date/Time   NA 140 08/24/2019 1001   NA 143 08/07/2018 1042   K 4.3 08/24/2019 1001   CL 101 08/24/2019 1001   CO2 29 08/24/2019 1001   GLUCOSE 96 08/24/2019 1001   BUN 11 08/24/2019 1001   BUN 13 08/07/2018 1042   CREATININE 0.77 08/24/2019 1001   CALCIUM 10.0 08/24/2019 1001   PROT 7.0 08/24/2019 1001   PROT 7.5 08/07/2018 1042   ALBUMIN 4.7 08/24/2019 1001   ALBUMIN 4.9 (H) 08/07/2018 1042   AST 17 08/24/2019 1001   ALT 13 08/24/2019 1001   ALKPHOS 91 08/24/2019 1001   BILITOT 0.4 08/24/2019 1001   BILITOT 0.3 08/07/2018 1042   GFRNONAA 72 08/07/2018 1042   GFRAA 83 08/07/2018 1042    Imaging Studies: No results found.  Assessment and Plan:   KEANA DUEITT is a 65 y.o. y/o female has been referred for GERD  Patient symptoms are well controlled with PPI However, she has  been taking the medication since November 2021  Patient educated extensively on acid reflux lifestyle modification, including buying a bed wedge, not eating 3 hrs before bedtime, diet modifications, and handout given for the same.   She is agreeable to trying H2 RA instead Will prescribe H2 RA and if her symptoms worsen on this I have advised her to let us know in 1 to 2 weeks and we may need to restart PPI  The goal would be to  try to get her off the PPI eventually with lifestyle modifications as described above  If symptoms continue, and due to new on symptoms symptoms after 65 years of age, we may need to consider upper endoscopy  Option of proceeding with upper endoscopy at this time also discussed,.  Patient would like conservative management described above first    Dr Vonda Antigua  Speech recognition software was used to dictate the above note.

## 2020-04-18 ENCOUNTER — Other Ambulatory Visit: Payer: Self-pay

## 2020-04-18 ENCOUNTER — Ambulatory Visit (INDEPENDENT_AMBULATORY_CARE_PROVIDER_SITE_OTHER): Payer: Medicare Other | Admitting: Internal Medicine

## 2020-04-18 ENCOUNTER — Encounter: Payer: Self-pay | Admitting: Internal Medicine

## 2020-04-18 VITALS — BP 130/70 | HR 89 | Temp 98.1°F | Resp 16 | Ht 61.0 in | Wt 152.0 lb

## 2020-04-18 DIAGNOSIS — F439 Reaction to severe stress, unspecified: Secondary | ICD-10-CM

## 2020-04-18 DIAGNOSIS — M25551 Pain in right hip: Secondary | ICD-10-CM

## 2020-04-18 DIAGNOSIS — E559 Vitamin D deficiency, unspecified: Secondary | ICD-10-CM

## 2020-04-18 DIAGNOSIS — R319 Hematuria, unspecified: Secondary | ICD-10-CM | POA: Diagnosis not present

## 2020-04-18 DIAGNOSIS — K219 Gastro-esophageal reflux disease without esophagitis: Secondary | ICD-10-CM

## 2020-04-18 LAB — URINALYSIS, ROUTINE W REFLEX MICROSCOPIC
Bilirubin Urine: NEGATIVE
Ketones, ur: NEGATIVE
Leukocytes,Ua: NEGATIVE
Nitrite: NEGATIVE
Specific Gravity, Urine: <=1.005 — AB (ref 1.000–1.030)
Total Protein, Urine: NEGATIVE
Urine Glucose: NEGATIVE
Urobilinogen, UA: 0.2 (ref 0.0–1.0)
WBC, UA: NONE SEEN (ref 0–?)
pH: 6 (ref 5.0–8.0)

## 2020-04-18 MED ORDER — PANTOPRAZOLE SODIUM 40 MG PO TBEC: 40.0000 mg | DELAYED_RELEASE_TABLET | Freq: Every day | ORAL | 3 refills | Status: DC

## 2020-04-18 NOTE — Progress Notes (Signed)
[name], female   DOB: 06-20-1955, 65 y.o.   MRN: [id number]   Subjective:    [name], female    DOB: August 19, 1955, 65 y.o.   MRN: [id number]  HPI This visit occurred during the SARS-CoV-2 public health emergency.  Safety protocols were in place, including screening questions prior to the visit, additional usage of staff PPE, and extensive cleaning of exam room while observing appropriate contact time as indicated for disinfecting solutions.  Patient here for a scheduled follow up.  Here to follow up regarding acid reflux.  Had been taking protonix.  Saw GI recently.  Changed to pepcid - to see if could control with H2 blocker.  Stopped protonix and started pepcid Monday 04/07/20.  Has started having symptoms - reported as a feeling in her upper chest.  States feels her UGI symptoms are flaring back up.  Resolved on protonix.  Discussed PPI and f/u with GI.  She also reports right hip pain.  Was working with weights at the gym and exercising on a rolling machine.  Heard a po - right thigh. Will intermittent feel numbness and it will feel cold.  Just one isolated section of her thigh - outer upper thigh.  No pain, numbness or tingling running down her leg.  No weakness.  If stands for along time - low back will bother her.  Bothers her going up steps.  No chest pain or sob reported.     Past Medical History:  Diagnosis Date  . 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 Osteoporosis   . Vitamin D deficiency    Past Surgical History:  Procedure Laterality Date  . BREAST EXCISIONAL BIOPSY Left 1989   benign  . BREAST SURGERY  1989   left breast biopsy  . TUBAL LIGATION     Family History  Problem Relation Age of Onset  . Breast cancer Mother 78       died age 26  . Parkinson's disease Father   . Lymphoma Maternal Grandmother        non-hodgkin's  . Ovarian cancer Neg Hx   . Diabetes Neg Hx   . Colon cancer Neg Hx   . Heart disease Neg Hx     Social History   Socioeconomic History  . Marital status: Married    Spouse name: Not on file  . Number of children: 1  . Years of education: Not on file  . Highest education level: Not on file  Occupational History  . Not on file  Tobacco Use  . Smoking status: Never Smoker  . Smokeless tobacco: Never Used  Substance and Sexual Activity  . Alcohol use: Yes    Alcohol/week: 0.0 standard drinks    Comment: occasional  . Drug use: No  . Sexual activity: Yes    Birth control/protection: Surgical  Other Topics Concern  . Not on file  Social History Narrative  . Not on file   Social Determinants of Health   Financial Resource Strain: Not on file  Food Insecurity: Not on file  Transportation Needs: Not on file  Physical Activity: Not on file  Stress: Not on file  Social Connections: Not on file    Outpatient Encounter Medications as of 04/18/2020  Medication Sig  . Ascorbic Acid (VITAMIN C) 1000 MG tablet Take 1,000 mg by mouth daily.  . calcium carbonate (OS-CAL - DOSED IN MG OF ELEMENTAL CALCIUM) 1250 (500 Ca) MG tablet Take 1 tablet  by mouth.  . famotidine (PEPCID) 20 MG tablet Take 1 tablet (20 mg total) by mouth daily.  . Multiple Vitamins-Minerals (MULTIVITAMIN PO) Take by mouth.  . triamcinolone cream (KENALOG) 0.1 % Apply 1 application topically 2 (two) times daily.  Marland Kitchen VITAMIN D, CHOLECALCIFEROL, PO Take by mouth.  . zinc gluconate 50 MG tablet Take 50 mg by mouth daily.  . [DISCONTINUED] pantoprazole (PROTONIX) 40 MG tablet Take 1 tablet (40 mg total) by mouth daily.  . pantoprazole (PROTONIX) 40 MG tablet Take 1 tablet (40 mg total) by mouth daily.   No facility-administered encounter medications on file as of 04/18/2020.    Review of Systems  Constitutional: Negative for appetite change and unexpected weight change.  HENT: Negative for congestion and sinus pressure.   Respiratory: Negative for cough, chest tightness and shortness of breath.    Cardiovascular: Negative for chest pain, palpitations and leg swelling.  Gastrointestinal: Negative for abdominal pain, diarrhea, nausea and vomiting.       Acid reflux as outlined.    Genitourinary: Negative for difficulty urinating and dysuria.  Musculoskeletal: Negative for joint swelling and myalgias.       Right hip and right outer thigh pain as outlined.   Skin: Negative for color change and rash.  Neurological: Negative for dizziness, light-headedness and headaches.  Psychiatric/Behavioral: Negative for agitation and dysphoric mood.       Objective:    Physical Exam Vitals reviewed.  Constitutional:      General: She is not in acute distress.    Appearance: Normal appearance.  HENT:     Head: Normocephalic and atraumatic.     Right Ear: External ear normal.     Left Ear: External ear normal.     Mouth/Throat:     Mouth: Oropharynx is clear and moist.  Eyes:     General: No scleral icterus.       Right eye: No discharge.        Left eye: No discharge.     Conjunctiva/sclera: Conjunctivae normal.  Neck:     Thyroid: No thyromegaly.  Cardiovascular:     Rate and Rhythm: Normal rate and regular rhythm.  Pulmonary:     Effort: No respiratory distress.     Breath sounds: Normal breath sounds. No wheezing.  Abdominal:     General: Bowel sounds are normal.     Palpations: Abdomen is soft.     Tenderness: There is no abdominal tenderness.  Musculoskeletal:        General: No swelling, tenderness or edema.     Cervical back: Neck supple. No tenderness.     Comments: Negative SLR. No pain in groin with abduction/adduction.    Lymphadenopathy:     Cervical: No cervical adenopathy.  Skin:    Findings: No erythema or rash.  Neurological:     Mental Status: She is alert.  Psychiatric:        Mood and Affect: Mood normal.        Behavior: Behavior normal.     BP 130/70   Pulse 89   Temp 98.1 F (36.7 C) (Oral)   Resp 16   Ht 5\' 1"  (1.549 m)   Wt 152 lb (68.9  kg)   SpO2 98%   BMI 28.72 kg/m  Wt Readings from Last 3 Encounters:  04/18/20 152 lb (68.9 kg)  04/09/20 153 lb 6.4 oz (69.6 kg)  01/16/20 149 lb 8 oz (67.8 kg)     Lab Results  Component  Value Date   WBC 6.8 08/24/2019   HGB 13.4 08/24/2019   HCT 40.2 08/24/2019   PLT 324.0 08/24/2019   GLUCOSE 96 08/24/2019   CHOL 185 08/24/2019   TRIG 105.0 08/24/2019   HDL 69.20 08/24/2019   LDLCALC 95 08/24/2019   ALT 13 08/24/2019   AST 17 08/24/2019   NA 140 08/24/2019   K 4.3 08/24/2019   CL 101 08/24/2019   CREATININE 0.77 08/24/2019   BUN 11 08/24/2019   CO2 29 08/24/2019   TSH 1.69 08/24/2019    DG Bone Density  Result Date: 09/19/2019 EXAM: DUAL X-RAY ABSORPTIOMETRY (DXA) FOR BONE MINERAL DENSITY IMPRESSION: Your patient Alura Olveda completed a BMD test on 09/18/2019 using the [place] (software version: 14.10) manufactured by [employer]. The following summarizes the results of our evaluation. Technologist:VLM PATIENT BIOGRAPHICAL: Name: Danyal, Adorno Patient ID: [id number] Birth Date: 04-09-1955 Height:     61.5 in. Gender: Female Exam Date: 09/18/2019 Weight:     148.0 lbs. Indications: Height Loss, Postmenopausal, Vitamin D Deficiency Fractures: Treatments: Vitamin D DENSITOMETRY RESULTS: Site      Region      Measured Date Measured Age WHO Classification Young Adult T-score BMD         %Change vs. Previous Significant Change (*) AP Spine L1-L4 09/18/2019 65.8 Osteoporosis -2.9 0.835 g/cm2 - - DualFemur Total Right 09/18/2019 65.8 Osteoporosis -2.9 0.641 g/cm2 - - ASSESSMENT: The BMD measured at AP Spine L1-L4 is 0.835 g/cm2 with a T-score of -2.9. This patient is considered OSTEOPOROTIC according to [facility]) criteria. The scan quality is good. World Pharmacologist [facility]) criteria for post-menopausal, Caucasian Women: Normal:                   T-score at or above -1 SD Osteopenia/low bone mass: T-score between -1 and -2.5  SD Osteoporosis:             T-score at or below -2.5 SD RECOMMENDATIONS: 1. All patients should optimize calcium and vitamin D intake. 2. Consider FDA-approved medical therapies in postmenopausal women and men aged 78 years and older, based on the following: a. A hip or vertebral(clinical or morphometric) fracture b. T-score < -2.5 at the femoral neck or spine after appropriate evaluation to exclude secondary causes c. Low bone mass (T-score between -1.0 and -2.5 at the femoral neck or spine) and a 10-year probability of a hip fracture > 3% or a 10-year probability of a major osteoporosis-related fracture > 20% based on the [place]-adapted WHO algorithm 3. Clinician judgment and/or patient preferences may indicate treatment for people with 10-year fracture probabilities above or below these levels FOLLOW-UP: People with diagnosed cases of osteoporosis or at high risk for fracture should have regular bone mineral density tests. For patients eligible for Medicare, routine testing is allowed once every 2 years. The testing frequency can be increased to one year for patients who have rapidly progressing disease, those who are receiving or discontinuing medical therapy to restore bone mass, or have additional risk factors. I have reviewed this report, and agree with the above findings. Mark A. Thornton Papas, M.D. [facility] Electronically Signed   By: Lavonia Dana M.D.   On: 09/19/2019 08:07   MM 3D SCREEN BREAST BILATERAL  Result Date: 09/19/2019 CLINICAL DATA:  Screening. EXAM: DIGITAL SCREENING BILATERAL MAMMOGRAM WITH TOMO AND CAD COMPARISON:  Previous exam(s). ACR Breast Density Category b: There are scattered areas of fibroglandular density. FINDINGS:  There are no findings suspicious for malignancy. Images were processed with CAD. IMPRESSION: No mammographic evidence of malignancy. A result letter of this screening mammogram will be mailed directly to the patient. RECOMMENDATION: Screening mammogram in one  year. (Code:SM-B-01Y) BI-RADS CATEGORY  1: Negative. Electronically Signed   By: Everlean Alstrom M.D.   On: 09/19/2019 15:08       Assessment & Plan:   Problem List Items Addressed This Visit    Gastroesophageal reflux disease    Increased acid reflux as outlined.  Saw GI. Note reviewed.  Has been off protonix and on pepcid for approximately 10 days.  Symptoms returning.  Stop pepcid.  Restart protonix.  Contact GI regarding f/u, given return of symptoms after stopping PPI.  Discussed f/u with GI and question of need for EGD.        Relevant Medications   pantoprazole (PROTONIX) 40 MG tablet   Hematuria - Primary    Saw Dr Erlene Quan 10/2019.  Recommended f/u urinalysis in 6 months.  Repeat urine today.       Relevant Orders   Urinalysis, Routine w reflex microscopic (Completed)   Right hip pain    Right hip pain/right thigh pain as outlined.  Numbness/tingling as outlined.  Discussed further w/up and evaluation.  Starting to walk more.  Stretches.  Follow. Notify me if desires further intervention.       Stress    Overall appears to be handling things well.  Follow.        Vitamin D deficiency    Continue vitamin d supplements.            Einar Pheasant, MD

## 2020-04-19 ENCOUNTER — Encounter: Payer: Self-pay | Admitting: Internal Medicine

## 2020-04-19 DIAGNOSIS — M25551 Pain in right hip: Secondary | ICD-10-CM | POA: Insufficient documentation

## 2020-04-19 NOTE — Assessment & Plan Note (Signed)
Continue vitamin d supplements.  ?

## 2020-04-19 NOTE — Assessment & Plan Note (Signed)
Overall appears to be handling things well.  Follow.  ?

## 2020-04-19 NOTE — Assessment & Plan Note (Signed)
Increased acid reflux as outlined.  Saw GI. Note reviewed.  Has been off protonix and on pepcid for approximately 10 days.  Symptoms returning.  Stop pepcid.  Restart protonix.  Contact GI regarding f/u, given return of symptoms after stopping PPI.  Discussed f/u with GI and question of need for EGD.

## 2020-04-19 NOTE — Assessment & Plan Note (Signed)
Saw Dr Erlene Quan 10/2019.  Recommended f/u urinalysis in 6 months.  Repeat urine today.

## 2020-04-19 NOTE — Assessment & Plan Note (Signed)
Right hip pain/right thigh pain as outlined.  Numbness/tingling as outlined.  Discussed further w/up and evaluation.  Starting to walk more.  Stretches.  Follow. Notify me if desires further intervention.

## 2020-04-23 ENCOUNTER — Telehealth: Payer: Self-pay | Admitting: Internal Medicine

## 2020-04-23 NOTE — Telephone Encounter (Signed)
-----   Message from Virgel Manifold, MD sent at 04/23/2020  1:52 PM EST ----- Regarding: RE: follow up - reflux Thank you for letting me know  Herb Grays, can you have her follow up with me in 4 weeks please  ----- Message ----- From: Einar Pheasant, MD Sent: 04/19/2020   2:36 PM EST To: Virgel Manifold, MD Subject: follow up - reflux                             You saw this patient recently for follow up  - GERD.  She had been on protonix and was changed to pepcid last week.  I saw her for a regular follow up this week.  She is starting to have return of upper GI symptoms.  I placed her back on protonix and we discussed follow up with you for question of need for EGD.  I wanted to give you an update.  Let me know if you want [place] to schedule a follow up with you.  Thank you for seeing her and taking care of her.    Thank you.  Einar Pheasant

## 2020-05-01 ENCOUNTER — Telehealth: Payer: Self-pay | Admitting: Internal Medicine

## 2020-05-01 NOTE — Telephone Encounter (Signed)
-----   Message from Hollice Espy, MD sent at 04/22/2020  7:57 AM EST ----- Regarding: RE: follow up I think she is good, no further work up needed.  If she ever does have "real" microscopic blood, refer back.  Hollice Espy, MD  ----- Message ----- From: Einar Pheasant, MD Sent: 04/22/2020   2:48 AM EST To: Hollice Espy, MD Subject: follow up                                      You saw Ms Moors in 10/2019.  You had recommended a follow up (6 month) urinalysis to confirm no red blood cells present.  Her f/u urine revealed trace hemoglobin with no red blood cells.  I wanted to forward this information to you.  Please let me know if I need to do anything more regarding follow up.  Thank you for your help and for seeing this patient.      Thank you again.   Einar Pheasant

## 2020-05-21 ENCOUNTER — Encounter: Payer: Self-pay | Admitting: Gastroenterology

## 2020-05-21 ENCOUNTER — Other Ambulatory Visit: Payer: Self-pay

## 2020-05-21 ENCOUNTER — Ambulatory Visit (INDEPENDENT_AMBULATORY_CARE_PROVIDER_SITE_OTHER): Payer: Medicare Other | Admitting: Gastroenterology

## 2020-05-21 VITALS — BP 123/79 | HR 87 | Ht 61.0 in | Wt 152.6 lb

## 2020-05-21 DIAGNOSIS — Z13810 Encounter for screening for upper gastrointestinal disorder: Secondary | ICD-10-CM

## 2020-05-21 DIAGNOSIS — K219 Gastro-esophageal reflux disease without esophagitis: Secondary | ICD-10-CM | POA: Diagnosis not present

## 2020-05-21 NOTE — Progress Notes (Signed)
Autumn Antigua, MD [address]  [place]  [place], [place] [address]  Main: [phone]  Fax: [phone]   Primary Care Physician: Autumn Pheasant, MD   Chief Complaint  Patient presents with  . Office Visit    Follow up GERD    HPI: Autumn Wolfe is a 65 y.o. female here for follow-up of GERD.  H2 RA was started on last visit however, patient reported having breakthrough symptoms daily after a week of taking it and called her PCP.  Since then she has been on Protonix 40 mg once daily which controls her symptoms.  She continues to report globus sensation.  No dysphagia, nausea or vomiting  Current Outpatient Medications  Medication Sig Dispense Refill  . Ascorbic Acid (VITAMIN C) 1000 MG tablet Take 1,000 mg by mouth daily.    . calcium carbonate (OS-CAL - DOSED IN MG OF ELEMENTAL CALCIUM) 1250 (500 Ca) MG tablet Take 1 tablet by mouth.    . famotidine (PEPCID) 20 MG tablet Take 1 tablet (20 mg total) by mouth daily. 90 tablet 0  . Multiple Vitamins-Minerals (MULTIVITAMIN PO) Take by mouth.    . pantoprazole (PROTONIX) 40 MG tablet Take 1 tablet (40 mg total) by mouth daily. 30 tablet 3  . triamcinolone cream (KENALOG) 0.1 % Apply 1 application topically 2 (two) times daily. 30 g 0  . VITAMIN D, CHOLECALCIFEROL, PO Take by mouth.    . zinc gluconate 50 MG tablet Take 50 mg by mouth daily.     No current facility-administered medications for this visit.    Allergies as of 05/21/2020  . (No Known Allergies)    ROS:  General: Negative for anorexia, weight loss, fever, chills, fatigue, weakness. ENT: Negative for hoarseness, difficulty swallowing , nasal congestion. CV: Negative for chest pain, angina, palpitations, dyspnea on exertion, peripheral edema.  Respiratory: Negative for dyspnea at rest, dyspnea on exertion, cough, sputum, wheezing.  GI: See history of present illness. GU:  Negative for dysuria, hematuria, urinary incontinence, urinary frequency,  nocturnal urination.  Endo: Negative for unusual weight change.    Physical Examination:   BP 123/79 (BP Location: Left Arm, Patient Position: Sitting, Cuff Size: Normal)   Pulse 87   Ht 5\' 1"  (1.549 m)   Wt 152 lb 9.6 oz (69.2 kg)   BMI 28.83 kg/m   General: Well-nourished, well-developed in no acute distress.  Eyes: No icterus. Conjunctivae pink. Mouth: Oropharyngeal mucosa moist and pink , no lesions erythema or exudate. Neck: Supple, Trachea midline Abdomen: Bowel sounds are normal, nontender, nondistended, no hepatosplenomegaly or masses, no abdominal bruits or hernia , no rebound or guarding.   Extremities: No lower extremity edema. No clubbing or deformities. Neuro: Alert and oriented x 3.  Grossly intact. Skin: Warm and dry, no jaundice.   Psych: Alert and cooperative, normal mood and affect.   Labs: CMP     Component Value Date/Time   NA 140 08/24/2019 1001   NA 143 08/07/2018 1042   K 4.3 08/24/2019 1001   CL 101 08/24/2019 1001   CO2 29 08/24/2019 1001   GLUCOSE 96 08/24/2019 1001   BUN 11 08/24/2019 1001   BUN 13 08/07/2018 1042   CREATININE 0.77 08/24/2019 1001   CALCIUM 10.0 08/24/2019 1001   PROT 7.0 08/24/2019 1001   PROT 7.5 08/07/2018 1042   ALBUMIN 4.7 08/24/2019 1001   ALBUMIN 4.9 (H) 08/07/2018 1042   AST 17 08/24/2019 1001   ALT 13 08/24/2019 1001  ALKPHOS 91 08/24/2019 1001   BILITOT 0.4 08/24/2019 1001   BILITOT 0.3 08/07/2018 1042   GFRNONAA 72 08/07/2018 1042   GFRAA 83 08/07/2018 1042   Lab Results  Component Value Date   WBC 6.8 08/24/2019   HGB 13.4 08/24/2019   HCT 40.2 08/24/2019   MCV 91.5 08/24/2019   PLT 324.0 08/24/2019    Imaging Studies: No results found.  Assessment and Plan:   Autumn Wolfe is a 65 y.o. y/o female here for follow-up of GERD  Patient is unable to come off PPI even with H2 RA on board  However, PPI is controlling his symptoms well besides "sensation  With her chronic GERD and age above 22,  guidelines do suggest benefit from Barrett's screening  Therefore, EGD will be scheduled for Barrett's screening  Alternative options of conservative management were discussed in detail, including but not limited to medication management, foregoing endoscopic procedures at this time and others.    I have discussed alternative options, risks & benefits,  which include, but are not limited to, bleeding, infection, perforation,respiratory complication & drug reaction.  The patient agrees with this plan & written consent will be obtained.    Patient educated extensively on acid reflux lifestyle modification, including buying a bed wedge, not eating 3 hrs before bedtime, diet modifications, and handout given for the same.   Patient states she is already sleeping on her flat surface and avoiding exacerbating foods and despite that she is unable to come off PPI would rather stay on it due to return of symptoms without it  (Risks of PPI use were discussed with patient including bone loss, C. Diff diarrhea, pneumonia, infections, CKD, electrolyte abnormalities.  Pt. Verbalizes understanding and chooses to continue the medication.)    Dr Autumn Wolfe

## 2020-05-28 ENCOUNTER — Other Ambulatory Visit
Admission: RE | Admit: 2020-05-28 | Discharge: 2020-05-28 | Disposition: A | Discharge status: Home / Self Care | Payer: Medicare Other | Source: Ambulatory Visit | Attending: Gastroenterology | Admitting: Gastroenterology

## 2020-05-28 ENCOUNTER — Other Ambulatory Visit: Payer: Self-pay

## 2020-05-28 DIAGNOSIS — Z20822 Contact with and (suspected) exposure to covid-19: Secondary | ICD-10-CM | POA: Diagnosis not present

## 2020-05-28 DIAGNOSIS — Z01812 Encounter for preprocedural laboratory examination: Secondary | ICD-10-CM | POA: Diagnosis present

## 2020-05-28 LAB — SARS CORONAVIRUS 2 (TAT 6-24 HRS): SARS Coronavirus 2: NEGATIVE

## 2020-05-29 ENCOUNTER — Encounter: Payer: Self-pay | Admitting: Gastroenterology

## 2020-05-30 ENCOUNTER — Encounter
Admission: RE | Disposition: A | Discharge status: Home / Self Care | Payer: Self-pay | Source: Home / Self Care | Attending: Gastroenterology

## 2020-05-30 ENCOUNTER — Ambulatory Visit
Admission: RE | Admit: 2020-05-30 | Discharge: 2020-05-30 | Disposition: A | Discharge status: Home / Self Care | Payer: Medicare Other | Attending: Gastroenterology | Admitting: Gastroenterology

## 2020-05-30 ENCOUNTER — Ambulatory Visit: Payer: Medicare Other | Admitting: Registered Nurse

## 2020-05-30 ENCOUNTER — Encounter: Payer: Self-pay | Admitting: Gastroenterology

## 2020-05-30 ENCOUNTER — Other Ambulatory Visit: Payer: Self-pay

## 2020-05-30 DIAGNOSIS — K2270 Barrett's esophagus without dysplasia: Secondary | ICD-10-CM

## 2020-05-30 DIAGNOSIS — K319 Disease of stomach and duodenum, unspecified: Secondary | ICD-10-CM | POA: Insufficient documentation

## 2020-05-30 DIAGNOSIS — Z13810 Encounter for screening for upper gastrointestinal disorder: Secondary | ICD-10-CM | POA: Diagnosis present

## 2020-05-30 DIAGNOSIS — K317 Polyp of stomach and duodenum: Secondary | ICD-10-CM

## 2020-05-30 DIAGNOSIS — K2289 Other specified disease of esophagus: Secondary | ICD-10-CM | POA: Diagnosis not present

## 2020-05-30 DIAGNOSIS — D132 Benign neoplasm of duodenum: Secondary | ICD-10-CM | POA: Diagnosis not present

## 2020-05-30 DIAGNOSIS — Z79899 Other long term (current) drug therapy: Secondary | ICD-10-CM | POA: Diagnosis not present

## 2020-05-30 DIAGNOSIS — Q402 Other specified congenital malformations of stomach: Secondary | ICD-10-CM | POA: Diagnosis not present

## 2020-05-30 HISTORY — PX: ESOPHAGOGASTRODUODENOSCOPY (EGD) WITH PROPOFOL: SHX5813

## 2020-05-30 SURGERY — ESOPHAGOGASTRODUODENOSCOPY (EGD) WITH PROPOFOL
Anesthesia: General

## 2020-05-30 MED ORDER — LIDOCAINE HCL (CARDIAC) PF 100 MG/5ML IV SOSY
PREFILLED_SYRINGE | INTRAVENOUS | Status: DC | PRN
  Administered 2020-05-30: 100 mg via INTRAVENOUS

## 2020-05-30 MED ORDER — DEXMEDETOMIDINE HCL 200 MCG/2ML IV SOLN
INTRAVENOUS | Status: DC | PRN
  Administered 2020-05-30: 12 ug via INTRAVENOUS

## 2020-05-30 MED ORDER — PROPOFOL 10 MG/ML IV BOLUS
INTRAVENOUS | Status: DC | PRN
  Administered 2020-05-30: 60 mg via INTRAVENOUS

## 2020-05-30 MED ORDER — DEXMEDETOMIDINE (PRECEDEX) IN NS 20 MCG/5ML (4 MCG/ML) IV SYRINGE
PREFILLED_SYRINGE | INTRAVENOUS | Status: AC
  Filled 2020-05-30: qty 10

## 2020-05-30 MED ORDER — SODIUM CHLORIDE 0.9 % IV SOLN
INTRAVENOUS | Status: DC
  Administered 2020-05-30: 20 mL/h via INTRAVENOUS

## 2020-05-30 MED ORDER — PROPOFOL 500 MG/50ML IV EMUL
INTRAVENOUS | Status: DC | PRN
  Administered 2020-05-30: 150 ug/kg/min via INTRAVENOUS

## 2020-05-30 NOTE — Transfer of Care (Signed)
Immediate Anesthesia Transfer of Care Note  Patient: Autumn Wolfe  Procedure(s) Performed: ESOPHAGOGASTRODUODENOSCOPY (EGD) WITH PROPOFOL (N/A )  Patient Location: Endoscopy Unit  Anesthesia Type:General  Level of Consciousness: drowsy  Airway & Oxygen Therapy: Patient Spontanous Breathing  Post-op Assessment: Report given to RN and Post -op Vital signs reviewed and stable  Post vital signs: Reviewed and stable  Last Vitals:  Vitals Value Taken Time  BP 97/68 05/30/20 1138  Temp    Pulse 87 05/30/20 1138  Resp 17 05/30/20 1138  SpO2 94 % 05/30/20 1138  Vitals shown include unvalidated device data.  Last Pain:  Vitals:   05/30/20 0913  TempSrc: Temporal  PainSc: 0-No pain         Complications: No complications documented.

## 2020-05-30 NOTE — H&P (Signed)
Vonda Antigua, MD [address], [place], [place], [place], [address] [address], [place], [place], [place], [address] Phone: [phone]  Fax: [phone]  Primary Care Physician:  Einar Pheasant, MD   Pre-Procedure History & Physical: HPI:  Autumn Wolfe is a 65 y.o. female is here for an EGD.   Past Medical History:  Diagnosis Date  . 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 Osteoporosis   . Vitamin D deficiency     Past Surgical History:  Procedure Laterality Date  . BREAST EXCISIONAL BIOPSY Left 1989   benign  . BREAST SURGERY  1989   left breast biopsy  . TUBAL LIGATION      Prior to Admission medications   Medication Sig Start Date End Date Taking? Authorizing Provider  Ascorbic Acid (VITAMIN C) 1000 MG tablet Take 1,000 mg by mouth daily.   Yes [provider]  calcium carbonate (OS-CAL - DOSED IN MG OF ELEMENTAL CALCIUM) 1250 (500 Ca) MG tablet Take 1 tablet by mouth.   Yes [provider]  famotidine (PEPCID) 20 MG tablet Take 1 tablet (20 mg total) by mouth daily. 04/09/20  Yes Virgel Manifold, MD  Multiple Vitamins-Minerals (MULTIVITAMIN PO) Take by mouth.   Yes [provider]  pantoprazole (PROTONIX) 40 MG tablet Take 1 tablet (40 mg total) by mouth daily. 04/18/20  Yes Einar Pheasant, MD  triamcinolone cream (KENALOG) 0.1 % Apply 1 application topically 2 (two) times daily. 08/24/19  Yes Einar Pheasant, MD  VITAMIN D, CHOLECALCIFEROL, PO Take by mouth.   Yes [provider]  zinc gluconate 50 MG tablet Take 50 mg by mouth daily.   Yes [provider]    Allergies as of 05/21/2020  . (No Known Allergies)    Family History  Problem Relation Age of Onset  . Breast cancer Mother 30       died age 67  . Parkinson's disease Father   . Lymphoma Maternal Grandmother        non-hodgkin's  . Ovarian cancer Neg Hx   . Diabetes Neg Hx   . Colon cancer Neg Hx   . Heart disease Neg Hx      Social History   Socioeconomic History  . Marital status: Married    Spouse name: Not on file  . Number of children: 1  . Years of education: Not on file  . Highest education level: Not on file  Occupational History  . Not on file  Tobacco Use  . Smoking status: Never Smoker  . Smokeless tobacco: Never Used  Substance and Sexual Activity  . Alcohol use: Yes    Alcohol/week: 0.0 standard drinks    Comment: occasional  . Drug use: No  . Sexual activity: Yes    Birth control/protection: Surgical  Other Topics Concern  . Not on file  Social History Narrative  . Not on file   Social Determinants of Health   Financial Resource Strain: Not on file  Food Insecurity: Not on file  Transportation Needs: Not on file  Physical Activity: Not on file  Stress: Not on file  Social Connections: Not on file  Intimate Partner Violence: Not on file    Review of Systems: See HPI, otherwise negative ROS  Physical Exam: BP 135/84   Pulse (!) 101   Temp 97.7 F (36.5 C) (Temporal)   Resp 20   Ht 5\' 1"  (1.549 m)   Wt 68 kg   SpO2 100%  BMI 28.34 kg/m  General:   Alert,  pleasant and cooperative in NAD Head:  Normocephalic and atraumatic. Neck:  Supple; no masses or thyromegaly. Lungs:  Clear throughout to auscultation, normal respiratory effort.    Heart:  +S1, +S2, Regular rate and rhythm, No edema. Abdomen:  Soft, nontender and nondistended. Normal bowel sounds, without guarding, and without rebound.   Neurologic:  Alert and  oriented x4;  grossly normal neurologically.  Impression/Plan: Autumn Wolfe is here for an EGD for Acid Reflux/barretts screening.  Risks, benefits, limitations, and alternatives regarding the procedure have been reviewed with the patient.  Questions have been answered.  All parties agreeable.   Virgel Manifold, MD  05/30/2020, 10:56 AM

## 2020-05-30 NOTE — Op Note (Addendum)
[facility] Patient Name: Autumn Wolfe Procedure Date: 05/30/2020 10:58 AM MRN: [id number] Account #: [id number] Date of Birth: Jun 11, 1955 Admit Type: Outpatient Age: 65 Room: [facility] ENDO ROOM 2 Gender: Female Note Status: Finalized Procedure:             Upper GI endoscopy Indications:           Screening for Barrett's esophagus Providers:             Jakeem Grape B. Bonna Gains MD, MD Referring MD:          Einar Pheasant, MD (Referring MD) Medicines:             Monitored Anesthesia Care Complications:         No immediate complications. Procedure:             Pre-Anesthesia Assessment:                        - The risks and benefits of the procedure and the                         sedation options and risks were discussed with the                         patient. All questions were answered and informed                         consent was obtained.                        - Patient identification and proposed procedure were                         verified prior to the procedure.                        - ASA Grade Assessment: II - A patient with mild                         systemic disease.                        After obtaining informed consent, the endoscope was                         passed under direct vision. Throughout the procedure,                         the patient's blood pressure, pulse, and oxygen                         saturations were monitored continuously. The Endoscope                         was introduced through the mouth, and advanced to the                         second part of duodenum. The upper GI endoscopy was                         accomplished  with ease. The patient tolerated the                         procedure well. Findings:      One tongue of salmon-colored mucosa was present from 34 to 35 cm. No       other visible abnormalities were present. The maximum longitudinal       extent of these esophageal mucosal  changes was 1 cm in length. Mucosa       was biopsied with a cold forceps for histology. One specimen bottle was       sent to pathology.      2 4 mm mucosal nodules were found in the proximal esophagus, 20 cm from       the incisors. Biopsies were taken with a cold forceps for histology.      The exam of the esophagus was otherwise normal.      Patchy mildly erythematous mucosa without bleeding was found in the       gastric antrum. Biopsies were obtained in the gastric body, at the       incisura and in the gastric antrum with cold forceps for Helicobacter       pylori testing.      A few 4 to 5 mm sessile polyps with no bleeding and no stigmata of       recent bleeding were found in the gastric fundus. Biopsies were taken       with a cold forceps for histology. To stop active bleeding, two       hemostatic clips were successfully placed. There was no bleeding at the       end of the procedure.      The exam of the stomach was otherwise normal.      Localized mild mucosal changes characterized by white specks were found       in the second portion of the duodenum. Biopsies were taken with a cold       forceps for histology.      Nodular mucosa was found in the duodenal bulb. Biopsies were taken with       a cold forceps for histology.      The exam of the duodenum was otherwise normal. 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 Biopsied.                        - Mucosal nodule found in the esophagus. Biopsied.                        - Erythematous mucosa in the antrum.                        - A few gastric polyps. Biopsied. Clips were placed.                        - Mucosal changes in the duodenum. Biopsied.                        - Nodular mucosa in the duodenal bulb. Biopsied.                        -  Biopsies were obtained in the gastric body, at the                         incisura and in the  gastric antrum. Recommendation:        - Await pathology results.                        - Discharge patient to home.                        - Continue present medications.                        - Resume previous diet.                        - The findings and recommendations were discussed with                         the patient.                        - The findings and recommendations were discussed with                         the patient's family.                        - Return to primary care physician as previously                         scheduled.                        - Follow an antireflux regimen. Procedure Code(s):     --- Professional ---                        [phone], 71, Esophagogastroduodenoscopy, flexible,                         transoral; with control of bleeding, any method                        43239, Esophagogastroduodenoscopy, flexible,                         transoral; with biopsy, single or multiple Diagnosis Code(s):     --- Professional ---                        K22.70, Barrett's esophagus without dysplasia                        K22.8, Other specified diseases of esophagus                        K31.89, Other diseases of stomach and duodenum                        K31.7, Polyp of stomach and duodenum  Z13.810, Encounter for screening for upper                         gastrointestinal disorder CPT copyright 2019 American Medical Association. All rights reserved. The codes documented in this report are preliminary and upon coder review may  be revised to meet current compliance requirements.  Vonda Antigua, MD Margretta Sidle B. Bonna Gains MD, MD 05/30/2020 11:42:33 AM This report has been signed electronically. Number of Addenda: 0 Note Initiated On: 05/30/2020 10:58 AM Estimated Blood Loss:  Estimated blood loss: none.      [facility]

## 2020-05-30 NOTE — Anesthesia Postprocedure Evaluation (Signed)
Anesthesia Post Note  Patient: Autumn Wolfe  Procedure(s) Performed: ESOPHAGOGASTRODUODENOSCOPY (EGD) WITH PROPOFOL (N/A )  Patient location during evaluation: Endoscopy Anesthesia Type: General Level of consciousness: awake and alert and oriented Pain management: pain level controlled Vital Signs Assessment: post-procedure vital signs reviewed and stable Respiratory status: spontaneous breathing, nonlabored ventilation and respiratory function stable Cardiovascular status: blood pressure returned to baseline and stable Postop Assessment: no signs of nausea or vomiting Anesthetic complications: no   No complications documented.   Last Vitals:  Vitals:   05/30/20 0913 05/30/20 1138  BP: 135/84 97/68  Pulse: (!) 101 87  Resp: 20 14  Temp: 36.5 C (!) 36.4 C  SpO2: 100% 92%    Last Pain:  Vitals:   05/30/20 1158  TempSrc:   PainSc: 0-No pain                 Sakoya Win

## 2020-05-30 NOTE — Anesthesia Preprocedure Evaluation (Signed)
Anesthesia Evaluation  Patient identified by MRN, date of birth, ID band Patient awake    Reviewed: Allergy & Precautions, NPO status , Patient's Chart, lab work & pertinent test results  History of Anesthesia Complications Negative for: history of anesthetic complications  Airway Mallampati: III  TM Distance: >3 FB Neck ROM: Full    Dental no notable dental hx.    Pulmonary neg pulmonary ROS, neg sleep apnea, neg COPD,    breath sounds clear to auscultation- rhonchi (-) wheezing      Cardiovascular Exercise Tolerance: Good (-) hypertension(-) CAD, (-) Past MI, (-) Cardiac Stents and (-) CABG  Rhythm:Regular Rate:Normal - Systolic murmurs and - Diastolic murmurs    Neuro/Psych neg Seizures negative neurological ROS  negative psych ROS   GI/Hepatic Neg liver ROS, GERD  ,  Endo/Other  negative endocrine ROSneg diabetes  Renal/GU negative Renal ROS     Musculoskeletal negative musculoskeletal ROS (+)   Abdominal (+) - obese,   Peds  Hematology negative hematology ROS (+)   Anesthesia Other Findings Past Medical History: 04/2015: HPV (human papilloma virus) infection No date: Osteoporosis No date: Vitamin D deficiency   Reproductive/Obstetrics                             Anesthesia Physical Anesthesia Plan  ASA: II  Anesthesia Plan: General   Post-op Pain Management:    Induction: Intravenous  PONV Risk Score and Plan: 2 and Propofol infusion  Airway Management Planned: Natural Airway  Additional Equipment:   Intra-op Plan:   Post-operative Plan:   Informed Consent: I have reviewed the patients History and Physical, chart, labs and discussed the procedure including the risks, benefits and alternatives for the proposed anesthesia with the patient or authorized representative who has indicated his/her understanding and acceptance.     Dental advisory given  Plan Discussed  with: CRNA and Anesthesiologist  Anesthesia Plan Comments:         Anesthesia Quick Evaluation

## 2020-06-02 LAB — SURGICAL PATHOLOGY

## 2020-06-26 ENCOUNTER — Other Ambulatory Visit: Payer: Self-pay

## 2020-06-26 DIAGNOSIS — D132 Benign neoplasm of duodenum: Secondary | ICD-10-CM

## 2020-06-27 ENCOUNTER — Telehealth: Payer: Self-pay | Admitting: Gastroenterology

## 2020-06-27 NOTE — Telephone Encounter (Signed)
Good afternoon Dr. Early Osmond,  We have received a urgent referral from [place] Dr. Vonda Antigua.. The referral request is directed to you for patient treatment for Duodenal adenoma.   Please advise on scheduling.Marland Kitchen

## 2020-06-27 NOTE — Telephone Encounter (Signed)
-----   Message from Virgel Manifold, MD sent at 06/03/2020 12:47 PM EDT ----- Herb Grays please let the patient know, the biopsies from her duodenum (small bowel) show that it was a polyp.  This will need to be evaluated for removal, with Dr. Rush Landmark.  If patient is agreeable, please place referral for "duodenal polyp".  Please ensure patient has clinic follow-up in 4 to 6 weeks.

## 2020-07-02 ENCOUNTER — Other Ambulatory Visit: Payer: Self-pay

## 2020-07-02 DIAGNOSIS — D132 Benign neoplasm of duodenum: Secondary | ICD-10-CM

## 2020-07-02 NOTE — Telephone Encounter (Signed)
I have reviewed the request for referral now that I have returned. 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 Patty, I am okay with the patient being scheduled for a direct EGD with EMR attempt due to this not being noted to be a very large lesion.  She can be scheduled in a 60-minute EGD slot with EMR capability that day. Please let Dr. Bonna Gains and I know when she is scheduled. Thanks. GM

## 2020-07-02 NOTE — Telephone Encounter (Signed)
The pt has been scheduled for EGD EMR at [facility] with Dr Rush Landmark at 11 am COVID test on 5/31 at 51 am

## 2020-07-02 NOTE — Telephone Encounter (Signed)
Thanks for update. GM 

## 2020-07-02 NOTE — Telephone Encounter (Signed)
Left message on machine to call back  

## 2020-07-03 NOTE — Telephone Encounter (Signed)
EGD scheduled, pt instructed and medications reviewed.  Patient instructions mailed to home and in My Chart.  Patient to call with any questions or concerns.

## 2020-07-15 ENCOUNTER — Ambulatory Visit: Payer: Medicare Other | Admitting: Gastroenterology

## 2020-07-29 ENCOUNTER — Ambulatory Visit: Payer: Medicare Other

## 2020-07-29 ENCOUNTER — Telehealth: Payer: Self-pay

## 2020-07-29 ENCOUNTER — Other Ambulatory Visit (HOSPITAL_COMMUNITY)
Admission: RE | Admit: 2020-07-29 | Discharge: 2020-07-29 | Disposition: A | Discharge status: Home / Self Care | Payer: Medicare Other | Source: Ambulatory Visit | Attending: Gastroenterology | Admitting: Gastroenterology

## 2020-07-29 DIAGNOSIS — Z20822 Contact with and (suspected) exposure to covid-19: Secondary | ICD-10-CM | POA: Insufficient documentation

## 2020-07-29 DIAGNOSIS — Z01812 Encounter for preprocedural laboratory examination: Secondary | ICD-10-CM | POA: Diagnosis present

## 2020-07-29 LAB — SARS CORONAVIRUS 2 (TAT 6-24 HRS): SARS Coronavirus 2: NEGATIVE

## 2020-07-29 NOTE — Telephone Encounter (Signed)
Unable to reach patient for scheduled awv. No answer. Left message to reschedule. 

## 2020-07-30 ENCOUNTER — Encounter (HOSPITAL_COMMUNITY): Payer: Self-pay | Admitting: Gastroenterology

## 2020-07-30 ENCOUNTER — Other Ambulatory Visit: Payer: Self-pay

## 2020-07-30 NOTE — Progress Notes (Signed)
DUE TO COVID-19 ONLY ONE VISITOR IS ALLOWED TO COME WITH YOU AND STAY IN THE WAITING ROOM ONLY DURING PRE OP AND PROCEDURE DAY OF SURGERY.   Patient denies shortness of breath, fever, cough or chest pain.  PCP - Dr Einar Pheasant Cardiologist - n/a  Chest x-ray - n/a EKG - n/a Stress Test - n/a ECHO - n/a Cardiac Cath - n/a  STOP now taking any Aspirin (unless otherwise instructed by your surgeon), Aleve, Naproxen, Ibuprofen, Motrin, Advil, Goody's, BC's, all herbal medications, fish oil, and all vitamins.   Coronavirus Screening Covid test on  07/29/20 was negative.  Patient verbalized understanding of instructions that were given via phone.

## 2020-07-31 ENCOUNTER — Ambulatory Visit (HOSPITAL_COMMUNITY): Payer: Medicare Other | Admitting: Anesthesiology

## 2020-07-31 ENCOUNTER — Ambulatory Visit (HOSPITAL_COMMUNITY)
Admission: RE | Admit: 2020-07-31 | Discharge: 2020-07-31 | Disposition: A | Discharge status: Home / Self Care | Payer: Medicare Other | Attending: Gastroenterology | Admitting: Gastroenterology

## 2020-07-31 ENCOUNTER — Encounter (HOSPITAL_COMMUNITY): Payer: Self-pay | Admitting: Gastroenterology

## 2020-07-31 ENCOUNTER — Encounter (HOSPITAL_COMMUNITY)
Admission: RE | Disposition: A | Discharge status: Home / Self Care | Payer: Self-pay | Source: Home / Self Care | Attending: Gastroenterology

## 2020-07-31 ENCOUNTER — Other Ambulatory Visit: Payer: Self-pay

## 2020-07-31 DIAGNOSIS — Z803 Family history of malignant neoplasm of breast: Secondary | ICD-10-CM | POA: Insufficient documentation

## 2020-07-31 DIAGNOSIS — Z820 Family history of epilepsy and other diseases of the nervous system: Secondary | ICD-10-CM | POA: Insufficient documentation

## 2020-07-31 DIAGNOSIS — K222 Esophageal obstruction: Secondary | ICD-10-CM | POA: Diagnosis not present

## 2020-07-31 DIAGNOSIS — K219 Gastro-esophageal reflux disease without esophagitis: Secondary | ICD-10-CM | POA: Insufficient documentation

## 2020-07-31 DIAGNOSIS — Z807 Family history of other malignant neoplasms of lymphoid, hematopoietic and related tissues: Secondary | ICD-10-CM | POA: Insufficient documentation

## 2020-07-31 DIAGNOSIS — K317 Polyp of stomach and duodenum: Secondary | ICD-10-CM

## 2020-07-31 DIAGNOSIS — D132 Benign neoplasm of duodenum: Secondary | ICD-10-CM | POA: Insufficient documentation

## 2020-07-31 HISTORY — DX: Gastro-esophageal reflux disease without esophagitis: K21.9

## 2020-07-31 HISTORY — PX: BIOPSY: SHX5522

## 2020-07-31 HISTORY — PX: POLYPECTOMY: SHX5525

## 2020-07-31 HISTORY — PX: ESOPHAGOGASTRODUODENOSCOPY (EGD) WITH PROPOFOL: SHX5813

## 2020-07-31 SURGERY — ESOPHAGOGASTRODUODENOSCOPY (EGD) WITH PROPOFOL
Anesthesia: Monitor Anesthesia Care

## 2020-07-31 MED ORDER — SODIUM CHLORIDE 0.9 % IV SOLN: INTRAVENOUS | Status: DC

## 2020-07-31 MED ORDER — PANTOPRAZOLE SODIUM 40 MG PO TBEC: 40.0000 mg | DELAYED_RELEASE_TABLET | Freq: Two times a day (BID) | ORAL | 3 refills | Status: DC

## 2020-07-31 MED ORDER — LACTATED RINGERS IV SOLN
INTRAVENOUS | Status: DC
  Administered 2020-07-31: 1000 mL via INTRAVENOUS

## 2020-07-31 MED ORDER — PROPOFOL 10 MG/ML IV BOLUS
INTRAVENOUS | Status: DC | PRN
  Administered 2020-07-31: 10 mg via INTRAVENOUS
  Administered 2020-07-31: 15 mg via INTRAVENOUS
  Administered 2020-07-31: 10 mg via INTRAVENOUS

## 2020-07-31 MED ORDER — PROPOFOL 10 MG/ML IV BOLUS: INTRAVENOUS | Status: DC | PRN

## 2020-07-31 MED ORDER — PROPOFOL 500 MG/50ML IV EMUL
INTRAVENOUS | Status: DC | PRN
  Administered 2020-07-31: 150 ug/kg/min via INTRAVENOUS

## 2020-07-31 SURGICAL SUPPLY — 14 items

## 2020-07-31 NOTE — Anesthesia Procedure Notes (Signed)
Procedure Name: MAC Date/Time: 07/31/2020 11:06 AM Performed by: Thelma Comp, CRNA Pre-anesthesia Checklist: Patient identified, Emergency Drugs available, Suction available, Patient being monitored and Timeout performed Patient Re-evaluated:Patient Re-evaluated prior to induction Oxygen Delivery Method: Simple face mask

## 2020-07-31 NOTE — H&P (Signed)
GASTROENTEROLOGY PROCEDURE H&P NOTE   Primary Care Physician: Einar Pheasant, MD  HPI: Autumn Wolfe is a 65 y.o. 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  Past Medical History:  Diagnosis Date  . GERD (gastroesophageal reflux disease)   . 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 Osteoporosis   . Vitamin D deficiency    Past Surgical History:  Procedure Laterality Date  . BREAST EXCISIONAL BIOPSY Left 1989   benign  . BREAST SURGERY  1989   left breast biopsy  . COLONOSCOPY     x 2  . ESOPHAGOGASTRODUODENOSCOPY (EGD) WITH PROPOFOL N/A 05/30/2020   Procedure: ESOPHAGOGASTRODUODENOSCOPY (EGD) WITH PROPOFOL;  Surgeon: Virgel Manifold, MD;  Location: [facility] ENDOSCOPY;  Service: Endoscopy;  Laterality: N/A;  . EYE SURGERY Bilateral 1999   lasik  . TUBAL LIGATION    . WISDOM TOOTH EXTRACTION     No current facility-administered medications for this encounter.   No Known Allergies Family History  Problem Relation Age of Onset  . Breast cancer Mother 49       died age 102  . Parkinson's disease Father   . Lymphoma Maternal Grandmother        non-hodgkin's  . Ovarian cancer Neg Hx   . Diabetes Neg Hx   . Colon cancer Neg Hx   . Heart disease Neg Hx    Social History   Socioeconomic History  . Marital status: Married    Spouse name: Not on file  . Number of children: 1  . Years of education: Not on file  . Highest education level: Not on file  Occupational History  . Not on file  Tobacco Use  . Smoking status: Never Smoker  . Smokeless tobacco: Never Used  Vaping Use  . Vaping Use: Never used  Substance and Sexual Activity  . Alcohol use: Not Currently    Comment: occasional  . Drug use: No  . Sexual activity: Yes    Birth control/protection: Surgical, Post-menopausal    Comment: Tubal Ligation  Other Topics Concern  . Not on file  Social History Narrative  . Not on file   Social  Determinants of Health   Financial Resource Strain: Not on file  Food Insecurity: Not on file  Transportation Needs: Not on file  Physical Activity: Not on file  Stress: Not on file  Social Connections: Not on file  Intimate Partner Violence: Not on file    Physical Exam: Vital signs in last 24 hours: Weight:  [62.6 kg] 62.6 kg (06/01 1656)   GEN: NAD EYE: Sclerae anicteric ENT: MMM CV: Non-tachycardic GI: Soft, NT/ND NEURO:  Alert & Oriented x 3  Lab Results: No results for input(s): WBC, HGB, HCT, PLT in the last 72 hours. BMET No results for input(s): NA, K, CL, CO2, GLUCOSE, BUN, CREATININE, CALCIUM in the last 72 hours. LFT No results for input(s): PROT, ALBUMIN, AST, ALT, ALKPHOS, BILITOT, BILIDIR, IBILI in the last 72 hours. PT/INR No results for input(s): LABPROT, INR in the last 72 hours.   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  Based upon the description and endoscopic pictures I do feel that it is reasonable to pursue an Advanced Polypectomy attempt of the polyp/lesion.  We discussed some of the techniques of advanced polypectomy which include Endoscopic Mucosal Resection, OVESCO Full-Thickness Resection, Endorotor  Morcellation, and Tissue Ablation via Fulguration.  We also reviewed images of typical techniques as noted above.  The risks and benefits of endoscopic evaluation were discussed with the Autumn Wolfe; these include but are not limited to the risk of perforation, infection, bleeding, missed lesions, lack of diagnosis, severe illness requiring hospitalization, as well as anesthesia and sedation related illnesses.  During attempts at advanced resection, the risks of bleeding and perforation/leak are increased as opposed to diagnostic and screening procedures, and that was discussed with the Autumn Wolfe as well.   In addition, I explained that with the possible need for piecemeal  resection, subsequent short-interval endoscopic evaluation for follow up and potential retreatment of the lesion/area may be necessary.  I did offer, a referral to surgery in order for Autumn Wolfe to have opportunity to discuss surgical management/intervention prior to finalizing decision for attempt at endoscopic removal, however, the Autumn Wolfe deferred on this.  If, after attempt at removal of the polyp/lesion, it is found that the Autumn Wolfe has a complication or that an invasive lesion or malignant lesion is found, or that the polyp/lesion continues to recur, the Autumn Wolfe is aware and understands that surgery may still be indicated/required.  All Autumn Wolfe questions were answered, to the best of my ability, and the Autumn Wolfe agrees to the aforementioned plan of action with follow-up as indicated.  The risks and benefits of endoscopic evaluation were discussed with the Autumn Wolfe; these include but are not limited to the risk of perforation, infection, bleeding, missed lesions, lack of diagnosis, severe illness requiring hospitalization, as well as anesthesia and sedation related illnesses.  The Autumn Wolfe is agreeable to proceed.    Justice Britain, MD [facility] Advanced Endoscopy Office # [phone]

## 2020-07-31 NOTE — Op Note (Signed)
[facility] Patient Name: Autumn Wolfe Procedure Date : 07/31/2020 MRN: [id number] Attending MD: Justice Britain , MD Date of Birth: Jan 20, 1956 CSN: [id number] Age: 65 Admit Type: Inpatient Procedure:                Upper GI endoscopy Indications:              Polyps in the duodenum, For therapy of polyps in                            the duodenum Providers:                Justice Britain, MD, Kary Kos RN, RN, Ladona Ridgel, Technician, Referring MD:             Lennette Bihari. Bonna Gains MD, MD, Einar Pheasant, MD Medicines:                Monitored Anesthesia Care Complications:            No immediate complications. Estimated Blood Loss:     Estimated blood loss was minimal. Procedure:                Pre-Anesthesia Assessment:                           - Prior to the procedure, a History and Physical                            was performed, and patient medications and                            allergies were reviewed. The patient's tolerance of                            previous anesthesia was also reviewed. The risks                            and benefits of the procedure and the sedation                            options and risks were discussed with the patient.                            All questions were answered, and informed consent                            was obtained. Prior Anticoagulants: The patient has                            taken no previous anticoagulant or antiplatelet                            agents. ASA Grade Assessment: II - A patient with  mild systemic disease. After reviewing the risks                            and benefits, the patient was deemed in                            satisfactory condition to undergo the procedure.                           After obtaining informed consent, the endoscope was                            passed under direct vision. Throughout the                             procedure, the patient's blood pressure, pulse, and                            oxygen saturations were monitored continuously. The                            GIF-H190 ([id number]) Olympus gastroscope was                            introduced through the mouth, and advanced to the                            third part of duodenum. The TJF- Q180V ([id number])                            Olympus duodenoscope was introduced through the                            mouth, and advanced to the second part of duodenum.                            The upper GI endoscopy was accomplished without                            difficulty. The patient tolerated the procedure. Scope In: Scope Out: Findings:      No gross lesions were noted in the proximal esophagus and in the mid       esophagus.      One tongue of salmon-colored mucosa was present from 35 to 36 cm. No       other visible abnormalities were present. Biopsies were taken with a       cold forceps for histology.      A non-obstructing Schatzki ring was found at the gastroesophageal       junction.      The Z-line was irregular and was found 36 cm from the incisors.      No gross lesions were noted in the entire examined stomach.      Three 2 to 3 mm sessile polyps were found in the duodenal bulb. These       polyps were  removed with a cold snare. Resection and retrieval were       complete.      A single 2 mm sessile polyp was found in the proximal second portion of       the duodenum. The polyp was removed with a cold snare. Resection and       retrieval were complete.      A single 2 mm sessile polyp was found in the inferior aspect of the       major papilla. The polyp was removed with a cold biopsy forceps.       Resection and retrieval were complete.      The major papilla was otherwise normal.      Normal mucosa was found in the third portion of the duodenum. Impression:               - No gross lesions in esophagus  proximally.                           - Salmon-colored mucosa suspicious for Barrett's                            esophagus distally. Biopsied.                           - Non-obstructing Schatzki ring.                           - Z-line irregular, 36 cm from the incisors.                           - No gross lesions in the stomach.                           - Three duodenal polypoid lesions in bulb. Resected                            and retrieved.                           - A single duodenal polypoid lesion in proximal D2.                            Resected and retrieved.                           - A single duodenal polypoid lesion inferior to                            major papilla. Resected and retrieved.                           - Normal major papilla otherwise.                           - Normal mucosa was found in the third portion of  the duodenum. Recommendation:           - The patient will be observed post-procedure,                            until all discharge criteria are met.                           - Discharge patient to home.                           - Patient has a contact number available for                            emergencies. The signs and symptoms of potential                            delayed complications were discussed with the                            patient. Return to normal activities tomorrow.                            Written discharge instructions were provided to the                            patient.                           - Increase PPI to 40 mg twice daily for 1 month and                            then decrease back to once daily for symptoms of                            GERD.                           - Consideration of repeat EGD in 1-year to                            re-evaluate the region no matter pathology, but                            most definitely if pathology shows any adenomatous                             tissue having been found.                           - The findings and recommendations were discussed                            with the patient.                           -  The findings and recommendations were discussed                            with the patient's family. Procedure Code(s):        --- Professional ---                           [phone], Esophagogastroduodenoscopy, flexible,                            transoral; with removal of tumor(s), polyp(s), or                            other lesion(s) by snare technique Diagnosis Code(s):        --- Professional ---                           K22.8, Other specified diseases of esophagus                           K22.2, Esophageal obstruction                           K31.7, Polyp of stomach and duodenum CPT copyright 2019 [employer]. All rights reserved. The codes documented in this report are preliminary and upon coder review may  be revised to meet current compliance requirements. Justice Britain, MD 07/31/2020 12:15:23 PM Number of Addenda: 0

## 2020-07-31 NOTE — Discharge Instructions (Signed)
YOU HAD AN ENDOSCOPIC PROCEDURE TODAY: Refer to the procedure report and other information in the discharge instructions given to you for any specific questions about what was found during the examination. If this information does not answer your questions, please call [place] office at [phone] to clarify.   YOU SHOULD EXPECT: Some feelings of bloating in the abdomen. Passage of more gas than usual. Walking can help get rid of the air that was put into your GI tract during the procedure and reduce the bloating. If you had a lower endoscopy (such as a colonoscopy or flexible sigmoidoscopy) you may notice spotting of blood in your stool or on the toilet paper. Some abdominal soreness may be present for a day or two, also.  DIET: Your first meal following the procedure should be a light meal and then it is ok to progress to your normal diet. A half-sandwich or bowl of soup is an example of a good first meal. Heavy or fried foods are harder to digest and may make you feel nauseous or bloated. Drink plenty of fluids but you should avoid alcoholic beverages for 24 hours. If you had a esophageal dilation, please see attached instructions for diet.    ACTIVITY: Your care partner should take you home directly after the procedure. You should plan to take it easy, moving slowly for the rest of the day. You can resume normal activity the day after the procedure however YOU SHOULD NOT DRIVE, use power tools, machinery or perform tasks that involve climbing or major physical exertion for 24 hours (because of the sedation medicines used during the test).   SYMPTOMS TO REPORT IMMEDIATELY: A gastroenterologist can be reached at any hour. Please call [phone]  for any of the following symptoms:   Following upper endoscopy (EGD, EUS, ERCP, esophageal dilation) Vomiting of blood or coffee ground material  New, significant abdominal pain  New, significant chest pain or pain under the shoulder blades  Painful or  persistently difficult swallowing  New shortness of breath  Black, tarry-looking or red, bloody stools  FOLLOW UP:  If any biopsies were taken you will be contacted by phone or by letter within the next 1-3 weeks. Call [phone]  if you have not heard about the biopsies in 3 weeks.  Please also call with any specific questions about appointments or follow up tests.  

## 2020-07-31 NOTE — Transfer of Care (Signed)
Immediate Anesthesia Transfer of Care Note  Patient: Autumn Wolfe  Procedure(s) Performed: ESOPHAGOGASTRODUODENOSCOPY (EGD) WITH PROPOFOL (N/A ) ENDOSCOPIC MUCOSAL RESECTION (N/A ) BIOPSY POLYPECTOMY  Patient Location: Endoscopy Unit  Anesthesia Type:MAC  Level of Consciousness: drowsy and patient cooperative  Airway & Oxygen Therapy: Patient Spontanous Breathing  Post-op Assessment: Report given to RN and Post -op Vital signs reviewed and stable  Post vital signs: Reviewed and stable  Last Vitals:  Vitals Value Taken Time  BP 126/77 07/31/20 1200  Temp    Pulse 87 07/31/20 1201  Resp 15 07/31/20 1201  SpO2 100 % 07/31/20 1201  Vitals shown include unvalidated device data.  Last Pain:  Vitals:   07/31/20 1041  TempSrc: Tympanic  PainSc: 0-No pain         Complications: No complications documented.

## 2020-07-31 NOTE — Anesthesia Preprocedure Evaluation (Signed)
Anesthesia Evaluation  Patient identified by MRN, date of birth, ID band Patient awake    Reviewed: Allergy & Precautions, NPO status , Patient's Chart, lab work & pertinent test results  History of Anesthesia Complications Negative for: history of anesthetic complications  Airway Mallampati: III  TM Distance: >3 FB Neck ROM: Full    Dental no notable dental hx.    Pulmonary neg pulmonary ROS, neg sleep apnea, neg COPD,    breath sounds clear to auscultation- rhonchi (-) wheezing      Cardiovascular Exercise Tolerance: Good (-) hypertension(-) CAD, (-) Past MI, (-) Cardiac Stents and (-) CABG  Rhythm:Regular Rate:Normal - Systolic murmurs and - Diastolic murmurs    Neuro/Psych neg Seizures negative neurological ROS  negative psych ROS   GI/Hepatic Neg liver ROS, GERD  ,  Endo/Other  negative endocrine ROSneg diabetes  Renal/GU negative Renal ROS     Musculoskeletal negative musculoskeletal ROS (+)   Abdominal (+) - obese,   Peds  Hematology negative hematology ROS (+)   Anesthesia Other Findings    Reproductive/Obstetrics                             Anesthesia Physical  Anesthesia Plan  ASA: II  Anesthesia Plan: MAC   Post-op Pain Management:    Induction: Intravenous  PONV Risk Score and Plan: 2 and Propofol infusion  Airway Management Planned: Natural Airway and Nasal Cannula  Additional Equipment:   Intra-op Plan:   Post-operative Plan:   Informed Consent: I have reviewed the patients History and Physical, chart, labs and discussed the procedure including the risks, benefits and alternatives for the proposed anesthesia with the patient or authorized representative who has indicated his/her understanding and acceptance.     Dental advisory given  Plan Discussed with: CRNA and Anesthesiologist  Anesthesia Plan Comments:         Anesthesia Quick Evaluation

## 2020-08-01 ENCOUNTER — Encounter: Payer: Self-pay | Admitting: Gastroenterology

## 2020-08-01 LAB — SURGICAL PATHOLOGY

## 2020-08-01 NOTE — Anesthesia Postprocedure Evaluation (Signed)
Anesthesia Post Note  Patient: Autumn Wolfe  Procedure(s) Performed: ESOPHAGOGASTRODUODENOSCOPY (EGD) WITH PROPOFOL (N/A ) ENDOSCOPIC MUCOSAL RESECTION (N/A ) BIOPSY POLYPECTOMY     Patient location during evaluation: PACU Anesthesia Type: MAC Level of consciousness: awake and alert Pain management: pain level controlled Vital Signs Assessment: post-procedure vital signs reviewed and stable Respiratory status: spontaneous breathing, nonlabored ventilation, respiratory function stable and patient connected to nasal cannula oxygen Cardiovascular status: stable and blood pressure returned to baseline Postop Assessment: no apparent nausea or vomiting Anesthetic complications: no   No complications documented.  Last Vitals:  Vitals:   07/31/20 1220 07/31/20 1230  BP: 121/74 (!) 141/85  Pulse: 83 89  Resp: 14 14  Temp:    SpO2: 100% 100%    Last Pain:  Vitals:   07/31/20 1230  TempSrc:   PainSc: 0-No pain                 Tiajuana Amass

## 2020-08-04 ENCOUNTER — Ambulatory Visit (INDEPENDENT_AMBULATORY_CARE_PROVIDER_SITE_OTHER): Payer: Medicare Other | Admitting: Gastroenterology

## 2020-08-04 ENCOUNTER — Encounter: Payer: Self-pay | Admitting: Gastroenterology

## 2020-08-04 ENCOUNTER — Other Ambulatory Visit: Payer: Self-pay

## 2020-08-04 VITALS — BP 120/80 | HR 103 | Temp 98.2°F | Wt 135.2 lb

## 2020-08-04 DIAGNOSIS — K219 Gastro-esophageal reflux disease without esophagitis: Secondary | ICD-10-CM | POA: Diagnosis not present

## 2020-08-04 MED ORDER — PANTOPRAZOLE SODIUM 40 MG PO TBEC: DELAYED_RELEASE_TABLET | ORAL | 0 refills | Status: DC

## 2020-08-04 MED ORDER — PANTOPRAZOLE SODIUM 20 MG PO TBEC: 20.0000 mg | DELAYED_RELEASE_TABLET | Freq: Every day | ORAL | 0 refills | Status: DC

## 2020-08-04 NOTE — Progress Notes (Signed)
Vonda Antigua, MD [address]  [place]  [place], [place] [address]  Main: [phone]  Fax: [phone]   Primary Care Physician: Einar Pheasant, MD  Chief complaint: GERD  HPI: Autumn Wolfe is a 65 y.o. female here for follow-up of reflux.  Patient underwent endoscopic procedure with Dr. Rush Landmark due to duodenal adenoma seen on previous upper endoscopy.  Please see his procedure report.  Duodenal lesions were removed but did not show adenoma and repeat upper endoscopy is recommended in 1 year.  Patient was recommended to be on PPI twice daily for 30 days and then decrease to once daily after that.  With twice daily dosing patient reports no further reflux symptoms that she was having on once daily dosing.  Biopsies of salmon-colored mucosa both during the April and June procedures did not show intestinal metaplasia or goblet cells.  Previous colonoscopy, in 2018 with Dr. Gustavo Lah, pathology report not available.  Current Outpatient Medications  Medication Sig Dispense Refill  . Ascorbic Acid (VITAMIN C) 1000 MG tablet Take 1,000 mg by mouth daily.    . calcium carbonate (OS-CAL - DOSED IN MG OF ELEMENTAL CALCIUM) 1250 (500 Ca) MG tablet Take 1 tablet by mouth daily with breakfast.    . Multiple Vitamins-Minerals (MULTIVITAMIN PO) Take 1 tablet by mouth daily.    . pantoprazole (PROTONIX) 40 MG tablet Take 1 tablet (40 mg total) by mouth 2 (two) times daily. 60 tablet 3  . triamcinolone cream (KENALOG) 0.1 % Apply 1 application topically 2 (two) times daily. (Patient taking differently: Apply 1 application topically 2 (two) times daily as needed (irritation).) 30 g 0  . Vitamin D, Cholecalciferol, 25 MCG (1000 UT) CAPS Take 1,000 Units by mouth daily.    Marland Kitchen zinc gluconate 50 MG tablet Take 50 mg by mouth daily.     No current facility-administered medications for this visit.    Allergies as of 08/04/2020  . (No Known Allergies)    ROS:  General: Negative  for anorexia, weight loss, fever, chills, fatigue, weakness. ENT: Negative for hoarseness, difficulty swallowing , nasal congestion. CV: Negative for chest pain, angina, palpitations, dyspnea on exertion, peripheral edema.  Respiratory: Negative for dyspnea at rest, dyspnea on exertion, cough, sputum, wheezing.  GI: See history of present illness. GU:  Negative for dysuria, hematuria, urinary incontinence, urinary frequency, nocturnal urination.  Endo: Negative for unusual weight change.    Physical Examination:   BP 120/80   Pulse (!) 103   Temp 98.2 F (36.8 C) (Temporal)   Wt 135 lb 3.2 oz (61.3 kg)   LMP  (LMP Unknown)   BMI 25.55 kg/m   General: Well-nourished, well-developed in no acute distress.  Eyes: No icterus. Conjunctivae pink. Mouth: Oropharyngeal mucosa moist and pink , no lesions erythema or exudate. Neck: Supple, Trachea midline Abdomen: Bowel sounds are normal, nontender, nondistended, no hepatosplenomegaly or masses, no abdominal bruits or hernia , no rebound or guarding.   Extremities: No lower extremity edema. No clubbing or deformities. Neuro: Alert and oriented x 3.  Grossly intact. Skin: Warm and dry, no jaundice.   Psych: Alert and cooperative, normal mood and affect.   Labs: CMP     Component Value Date/Time   NA 140 08/24/2019 1001   NA 143 08/07/2018 1042   K 4.3 08/24/2019 1001   CL 101 08/24/2019 1001   CO2 29 08/24/2019 1001   GLUCOSE 96 08/24/2019 1001   BUN 11 08/24/2019 1001   BUN  13 08/07/2018 1042   CREATININE 0.77 08/24/2019 1001   CALCIUM 10.0 08/24/2019 1001   PROT 7.0 08/24/2019 1001   PROT 7.5 08/07/2018 1042   ALBUMIN 4.7 08/24/2019 1001   ALBUMIN 4.9 (H) 08/07/2018 1042   AST 17 08/24/2019 1001   ALT 13 08/24/2019 1001   ALKPHOS 91 08/24/2019 1001   BILITOT 0.4 08/24/2019 1001   BILITOT 0.3 08/07/2018 1042   GFRNONAA 72 08/07/2018 1042   GFRAA 83 08/07/2018 1042   Lab Results  Component Value Date   WBC 6.8 08/24/2019    HGB 13.4 08/24/2019   HCT 40.2 08/24/2019   MCV 91.5 08/24/2019   PLT 324.0 08/24/2019    Imaging Studies: No results found.  Assessment and Plan:   Autumn Wolfe is a 65 y.o. y/o female here for follow-up of reflux  Reflux symptoms well controlled at this time  Patient has not picked up the prescription sent by Dr. Rush Landmark for her PPI and is about to run out of her PPI at home since she has been taking it twice daily as instructed by him  Therefore, as per those instructions I will refill for twice daily for 30 days, then down to once daily.  If symptoms remain well controlled, decrease Protonix to 20 mg once daily after that.  2 separate prescriptions were sent to the pharmacy reflecting this.  (Risks of PPI use were discussed with patient including bone loss, C. Diff diarrhea, pneumonia, infections, CKD, electrolyte abnormalities.  Pt. Verbalizes understanding and chooses to continue the medication.)  If symptoms recur, patient advised to call us back and she verbalized understanding  Follow-up in 3 months to reassess symptoms  Can potentially discontinue Protonix 20 mg once daily on next visit, versus use of H2 RA therapy    Dr Vonda Antigua

## 2020-08-20 ENCOUNTER — Ambulatory Visit (INDEPENDENT_AMBULATORY_CARE_PROVIDER_SITE_OTHER): Payer: Medicare Other

## 2020-08-20 VITALS — Ht 61.0 in | Wt 135.0 lb

## 2020-08-20 DIAGNOSIS — Z0000 Encounter for general adult medical examination without abnormal findings: Secondary | ICD-10-CM | POA: Diagnosis not present

## 2020-08-20 NOTE — Patient Instructions (Addendum)
Autumn Wolfe , Thank you for taking time to come for your Medicare Wellness Visit. I appreciate your ongoing commitment to your health goals. Please review the following plan we discussed and let me know if I can assist you in the future.   These are the goals we discussed:  Goals      Follow up with Primary Care Provider     As needed         This is a list of the screening recommended for you and due dates:  Health Maintenance  Topic Date Due   Tetanus Vaccine  01/15/2021*   Pneumonia vaccines (1 of 2 - PCV13) 01/15/2021*   Hepatitis C Screening: USPSTF Recommendation to screen - Ages 18-79 yo.  08/20/2021*   Flu Shot  09/29/2020   Mammogram  09/17/2021   Colon Cancer Screening  08/19/2026   DEXA scan (bone density measurement)  Completed   HPV Vaccine  Aged Out   COVID-19 Vaccine  Discontinued   Zoster (Shingles) Vaccine  Discontinued  *Topic was postponed. The date shown is not the original due date.    Advanced directives:   Conditions/risks identified: none new  Follow up in one year for your annual wellness visit    Preventive Care 65 Years and Older, Female Preventive care refers to lifestyle choices and visits with your health care provider that can promote health and wellness. What does preventive care include? A yearly physical exam. This is also called an annual well check. Dental exams once or twice a year. Routine eye exams. Ask your health care provider how often you should have your eyes checked. Personal lifestyle choices, including: Daily care of your teeth and gums. Regular physical activity. Eating a healthy diet. Avoiding tobacco and drug use. Limiting alcohol use. Practicing safe sex. Taking low-dose aspirin every day. Taking vitamin and mineral supplements as recommended by your health care provider. What happens during an annual well check? The services and screenings done by your health care provider during your annual well check will depend  on your age, overall health, lifestyle risk factors, and family history of disease. Counseling  Your health care provider may ask you questions about your: Alcohol use. Tobacco use. Drug use. Emotional well-being. Home and relationship well-being. Sexual activity. Eating habits. History of falls. Memory and ability to understand (cognition). Work and work Statistician. Reproductive health. Screening  You may have the following tests or measurements: Height, weight, and BMI. Blood pressure. Lipid and cholesterol levels. These may be checked every 5 years, or more frequently if you are over 58 years old. Skin check. Lung cancer screening. You may have this screening every year starting at age 9 if you have a 30-pack-year history of smoking and currently smoke or have quit within the past 15 years. Fecal occult blood test (FOBT) of the stool. You may have this test every year starting at age 56. Flexible sigmoidoscopy or colonoscopy. You may have a sigmoidoscopy every 5 years or a colonoscopy every 10 years starting at age 63. Hepatitis C blood test. Hepatitis B blood test. Sexually transmitted disease (STD) testing. Diabetes screening. This is done by checking your blood sugar (glucose) after you have not eaten for a while (fasting). You may have this done every 1-3 years. Bone density scan. This is done to screen for osteoporosis. You may have this done starting at age 13. Mammogram. This may be done every 1-2 years. Talk to your health care provider about how often you should have  regular mammograms. Talk with your health care provider about your test results, treatment options, and if necessary, the need for more tests. Vaccines  Your health care provider may recommend certain vaccines, such as: Influenza vaccine. This is recommended every year. Tetanus, diphtheria, and acellular pertussis (Tdap, Td) vaccine. You may need a Td booster every 10 years. Zoster vaccine. You may need  this after age 104. Pneumococcal 13-valent conjugate (PCV13) vaccine. One dose is recommended after age 17. Pneumococcal polysaccharide (PPSV23) vaccine. One dose is recommended after age 40. Talk to your health care provider about which screenings and vaccines you need and how often you need them. This information is not intended to replace advice given to you by your health care provider. Make sure you discuss any questions you have with your health care provider. Document Released: 03/14/2015 Document Revised: 11/05/2015 Document Reviewed: 12/17/2014 Elsevier Interactive Patient Education  2017 [place] Prevention in the Home Falls can cause injuries. They can happen to people of all ages. There are many things you can do to make your home safe and to help prevent falls. What can I do on the outside of my home? Regularly fix the edges of walkways and driveways and fix any cracks. Remove anything that might make you trip as you walk through a door, such as a raised step or threshold. Trim any bushes or trees on the path to your home. Use bright outdoor lighting. Clear any walking paths of anything that might make someone trip, such as rocks or tools. Regularly check to see if handrails are loose or broken. Make sure that both sides of any steps have handrails. Any raised decks and porches should have guardrails on the edges. Have any leaves, snow, or ice cleared regularly. Use sand or salt on walking paths during winter. Clean up any spills in your garage right away. This includes oil or grease spills. What can I do in the bathroom? Use night lights. Install grab bars by the toilet and in the tub and shower. Do not use towel bars as grab bars. Use non-skid mats or decals in the tub or shower. If you need to sit down in the shower, use a plastic, non-slip stool. Keep the floor dry. Clean up any water that spills on the floor as soon as it happens. Remove soap buildup in the tub  or shower regularly. Attach bath mats securely with double-sided non-slip rug tape. Do not have throw rugs and other things on the floor that can make you trip. What can I do in the bedroom? Use night lights. Make sure that you have a light by your bed that is easy to reach. Do not use any sheets or blankets that are too big for your bed. They should not hang down onto the floor. Have a firm chair that has side arms. You can use this for support while you get dressed. Do not have throw rugs and other things on the floor that can make you trip. What can I do in the kitchen? Clean up any spills right away. Avoid walking on wet floors. Keep items that you use a lot in easy-to-reach places. If you need to reach something above you, use a strong step stool that has a grab bar. Keep electrical cords out of the way. Do not use floor polish or wax that makes floors slippery. If you must use wax, use non-skid floor wax. Do not have throw rugs and other things on the floor that  can make you trip. What can I do with my stairs? Do not leave any items on the stairs. Make sure that there are handrails on both sides of the stairs and use them. Fix handrails that are broken or loose. Make sure that handrails are as long as the stairways. Check any carpeting to make sure that it is firmly attached to the stairs. Fix any carpet that is loose or worn. Avoid having throw rugs at the top or bottom of the stairs. If you do have throw rugs, attach them to the floor with carpet tape. Make sure that you have a light switch at the top of the stairs and the bottom of the stairs. If you do not have them, ask someone to add them for you. What else can I do to help prevent falls? Wear shoes that: Do not have high heels. Have rubber bottoms. Are comfortable and fit you well. Are closed at the toe. Do not wear sandals. If you use a stepladder: Make sure that it is fully opened. Do not climb a closed stepladder. Make  sure that both sides of the stepladder are locked into place. Ask someone to hold it for you, if possible. Clearly mark and make sure that you can see: Any grab bars or handrails. First and last steps. Where the edge of each step is. Use tools that help you move around (mobility aids) if they are needed. These include: Canes. Walkers. Scooters. Crutches. Turn on the lights when you go into a dark area. Replace any light bulbs as soon as they burn out. Set up your furniture so you have a clear path. Avoid moving your furniture around. If any of your floors are uneven, fix them. If there are any pets around you, be aware of where they are. Review your medicines with your doctor. Some medicines can make you feel dizzy. This can increase your chance of falling. Ask your doctor what other things that you can do to help prevent falls. This information is not intended to replace advice given to you by your health care provider. Make sure you discuss any questions you have with your health care provider. Document Released: 12/12/2008 Document Revised: 07/24/2015 Document Reviewed: 03/22/2014 Elsevier Interactive Patient Education  2017 [employer].

## 2020-08-20 NOTE — Progress Notes (Addendum)
Subjective:   Autumn Wolfe is a 65 y.o. female who presents for an Initial Medicare Annual Wellness Visit.  Review of Systems    No ROS.  Medicare Wellness Virtual Visit.  Visual/audio telehealth visit, UTA vital signs.   See social history for additional risk factors.   Cardiac Risk Factors include: advanced age (>91men, >24 women)     Objective:    Today's Vitals   08/20/20 1452  Weight: 135 lb (61.2 kg)  Height: 5\' 1"  (1.549 m)   Body mass index is 25.51 kg/m.  Advanced Directives 08/20/2020 07/31/2020 05/30/2020  Does Patient Have a Medical Advance Directive? Yes Yes Yes  Type of Paramedic of [place];Living will [place];Living will Living will  Does patient want to make changes to medical advance directive? No - Patient declined - -  Copy of [place] in Chart? No - copy requested No - copy requested -    Current Medications (verified) Outpatient Encounter Medications as of 08/20/2020  Medication Sig   Ascorbic Acid (VITAMIN C) 1000 MG tablet Take 1,000 mg by mouth daily.   calcium carbonate (OS-CAL - DOSED IN MG OF ELEMENTAL CALCIUM) 1250 (500 Ca) MG tablet Take 1 tablet by mouth daily with breakfast.   Multiple Vitamins-Minerals (MULTIVITAMIN PO) Take 1 tablet by mouth daily.   [START ON 10/03/2020] pantoprazole (PROTONIX) 20 MG tablet Take 1 tablet (20 mg total) by mouth daily.   pantoprazole (PROTONIX) 40 MG tablet Take 1 tablet (40 mg total) by mouth 2 (two) times daily for 30 days, THEN 1 tablet (40 mg total) daily.   triamcinolone cream (KENALOG) 0.1 % Apply 1 application topically 2 (two) times daily. (Patient taking differently: Apply 1 application topically 2 (two) times daily as needed (irritation).)   Vitamin D, Cholecalciferol, 25 MCG (1000 UT) CAPS Take 1,000 Units by mouth daily.   zinc gluconate 50 MG tablet Take 50 mg by mouth daily.   No facility-administered encounter medications on file  as of 08/20/2020.    Allergies (verified) Patient has no known allergies.   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   Osteoporosis    Vitamin D deficiency    Past Surgical History:  Procedure Laterality Date   BIOPSY  07/31/2020   Procedure: BIOPSY;  Surgeon: Irving Copas., MD;  Location: [facility];  Service: Gastroenterology;;   BREAST EXCISIONAL BIOPSY Left 1989   benign   [place]   left breast biopsy   COLONOSCOPY     x 2   ESOPHAGOGASTRODUODENOSCOPY (EGD) WITH PROPOFOL N/A 05/30/2020   Procedure: ESOPHAGOGASTRODUODENOSCOPY (EGD) WITH PROPOFOL;  Surgeon: Virgel Manifold, MD;  Location: [facility] ENDOSCOPY;  Service: Endoscopy;  Laterality: N/A;   ESOPHAGOGASTRODUODENOSCOPY (EGD) WITH PROPOFOL N/A 07/31/2020   Procedure: ESOPHAGOGASTRODUODENOSCOPY (EGD) WITH PROPOFOL;  Surgeon: Rush Landmark Telford Nab., MD;  Location: [facility];  Service: Gastroenterology;  Laterality: N/A;   EYE SURGERY Bilateral 1999   lasik   POLYPECTOMY  07/31/2020   Procedure: POLYPECTOMY;  Surgeon: Mansouraty, Telford Nab., MD;  [facility] ENDOSCOPY;  Service: Gastroenterology;;   TUBAL LIGATION     WISDOM TOOTH EXTRACTION     Family History  Problem Relation Age of Onset   Breast cancer Mother 45       died age 7   Parkinson's disease Father    Lymphoma Maternal Grandmother  non-hodgkin's   Ovarian cancer Neg Hx    Diabetes Neg Hx    Colon cancer Neg Hx    Heart disease Neg Hx    Social History   Socioeconomic History   Marital status: Married    Spouse name: Not on file   Number of children: 1   Years of education: Not on file   Highest education level: Not on file  Occupational History   Not on file  Tobacco Use   Smoking status: Never   Smokeless tobacco: Never  Vaping Use   Vaping Use: Never used  Substance and Sexual Activity   Alcohol use: Not  Currently    Comment: occasional   Drug use: No   Sexual activity: Yes    Birth control/protection: Surgical, Post-menopausal    Comment: Tubal Ligation  Other Topics Concern   Not on file  Social History Narrative   Not on file   Social Determinants of Health   Financial Resource Strain: Low Risk    Difficulty of Paying Living Expenses: Not hard at all  Food Insecurity: No Food Insecurity   Worried About Charity fundraiser in the Last Year: Never true   Ran Out of Food in the Last Year: Never true  Transportation Needs: No Transportation Needs   Lack of Transportation (Medical): No   Lack of Transportation (Non-Medical): No  Physical Activity: Insufficiently Active   Days of Exercise per Week: 2 days   Minutes of Exercise per Session: 60 min  Stress: No Stress Concern Present   Feeling of Stress : Not at all  Social Connections: Unknown   Frequency of Communication with Friends and Family: More than three times a week   Frequency of Social Gatherings with Friends and Family: More than three times a week   Attends Religious Services: Not on Electrical engineer or Organizations: Not on file   Attends Archivist Meetings: Not on file   Marital Status: Not on file    Tobacco Counseling Counseling given: Not Answered   Clinical Intake:  Pre-visit preparation completed: Yes        Diabetes: No  How often do you need to have someone help you when you read instructions, pamphlets, or other written materials from your doctor or pharmacy?: 1 - Never    Interpreter Needed?: No      Activities of Daily Living In your present state of health, do you have any difficulty performing the following activities: 08/20/2020  Hearing? N  Vision? N  Difficulty concentrating or making decisions? N  Walking or climbing stairs? N  Dressing or bathing? N  Doing errands, shopping? N  Preparing Food and eating ? N  Using the Toilet? N  In the past six  months, have you accidently leaked urine? N  Do you have problems with loss of bowel control? N  Managing your Medications? N  Managing your Finances? N  Housekeeping or managing your Housekeeping? N  Some recent data might be hidden    Patient Care Team: Einar Pheasant, MD as PCP - General (Internal Medicine)  Indicate any recent Medical Services you may have received from other than [facility] providers in the past year (date may be approximate).     Assessment:   This is a routine wellness examination for Autumn Wolfe.  I connected with Autumn Wolfe today by telephone and verified that I am speaking with the correct person using two identifiers. Location patient: home Location provider: work  Persons participating in the virtual visit: patient, nurse.    I discussed the limitations, risks, security and privacy concerns of performing an evaluation and management service by telephone and the availability of in person appointments. The patient expressed understanding and verbally consented to this telephonic visit.    Interactive audio and video telecommunications were attempted between this provider and patient, however failed, due to patient having technical difficulties OR patient did not have access to video capability.  We continued and completed visit with audio only.  Some vital signs may be absent or patient reported.   Hearing/Vision screen Hearing Screening - Comments:: Patient is able to hear conversational tones without difficulty.  No issues reported.   Vision Screening - Comments:: They have seen their ophthalmologist in the last 12 months. Wears readers only    Dietary issues and exercise activities discussed: Current Exercise Habits: Structured exercise class, Type of exercise: strength training/weights;calisthenics, Time (Minutes): 60, Frequency (Times/Week): 2, Weekly Exercise (Minutes/Week): 120, Intensity: Moderate  Healthy diet Good water intake   Goals Addressed              This Visit's Progress    Follow up with Primary Care Provider       As needed        Depression Screen PHQ 2/9 Scores 08/20/2020 01/16/2020 01/16/2020 08/24/2019 04/29/2015 03/14/2014  PHQ - 2 Score 0 1 1 0 0 0  PHQ- 9 Score - 1 - - - -    Fall Risk Fall Risk  08/20/2020 01/16/2020 08/24/2019 04/29/2015 03/14/2014  Falls in the past year? 0 0 0 No No  Number falls in past yr: 0 0 - - -  Injury with Fall? - 0 - - -  Risk for fall due to : - No Fall Risks - - -  Follow up Falls evaluation completed Falls evaluation completed Falls evaluation completed - -   ASSISTIVE DEVICES UTILIZED TO PREVENT FALLS:  Use of a cane, walker or w/c? No  TIMED UP AND GO:  Was the test performed? No .    Cognitive Function:  Patient is alert and oriented x3.       Immunizations  There is no immunization history on file for this patient.  Health Maintenance  There are no preventive care reminders to display for this patient.   Health Maintenance  Topic Date Due   TETANUS/TDAP  01/15/2021 (Originally 11/18/1974)   PNA vac Low Risk Adult (1 of 2 - PCV13) 01/15/2021 (Originally 11/19/2018)   Hepatitis C Screening  08/20/2021 (Originally 11/19/1973)   INFLUENZA VACCINE  09/29/2020   MAMMOGRAM  09/17/2021   COLONOSCOPY (Pts 45-41yrs Insurance coverage will need to be confirmed)  08/19/2026   DEXA SCAN  Completed   HPV VACCINES  Aged Out   COVID-19 Vaccine  Discontinued   Zoster Vaccines- Shingrix  Discontinued    Lung Cancer Screening: (Low Dose CT Chest recommended if Age 80-80 years, 30 pack-year currently smoking OR have quit w/in 15years.) does not qualify.   Hepatitis C Screening:   Deferred per patient.   Covid and [place] Vaccines -discontinued per patient.   Vision Screening: Recommended annual ophthalmology exams for early detection of glaucoma and other disorders of the eye. Is the patient up to date with their annual eye exam?  Yes   Dental Screening:  Recommended annual dental exams for proper oral hygiene  Community Resource Referral / Chronic Care Management: CRR required this visit?  No   CCM required this visit?  No      Plan:     I have personally reviewed and noted the following in the patient's chart:   Medical and social history Use of alcohol, tobacco or illicit drugs  Current medications and supplements including opioid prescriptions. Patient is not currently taking opioid prescriptions. Functional ability and status Nutritional status Physical activity Advanced directives List of other physicians Hospitalizations, surgeries, and ER visits in previous 12 months Vitals Screenings to include cognitive, depression, and falls Referrals and appointments  In addition, I have reviewed and discussed with patient certain preventive protocols, quality metrics, and best practice recommendations. A written personalized care plan for preventive services as well as general preventive health recommendations were provided to patient via mychart.     OBrien-Blaney, Autumn Snethen L, LPN   10/06/8108     I have reviewed the above information and agree with above.   Deborra Medina, MD

## 2020-08-27 ENCOUNTER — Encounter: Payer: Self-pay | Admitting: Internal Medicine

## 2020-09-15 ENCOUNTER — Ambulatory Visit: Payer: Medicare Other | Admitting: Internal Medicine

## 2020-10-27 ENCOUNTER — Ambulatory Visit: Payer: Medicare Other | Admitting: Internal Medicine

## 2020-11-06 ENCOUNTER — Other Ambulatory Visit (HOSPITAL_COMMUNITY)
Admission: RE | Admit: 2020-11-06 | Discharge: 2020-11-06 | Disposition: A | Discharge status: Home / Self Care | Payer: Medicare Other | Source: Ambulatory Visit | Attending: Internal Medicine | Admitting: Internal Medicine

## 2020-11-06 ENCOUNTER — Other Ambulatory Visit: Payer: Self-pay

## 2020-11-06 ENCOUNTER — Ambulatory Visit (INDEPENDENT_AMBULATORY_CARE_PROVIDER_SITE_OTHER): Payer: Medicare Other | Admitting: Internal Medicine

## 2020-11-06 VITALS — BP 120/70 | HR 90 | Temp 98.0°F | Resp 16 | Ht 61.0 in | Wt 128.0 lb

## 2020-11-06 DIAGNOSIS — Z01419 Encounter for gynecological examination (general) (routine) without abnormal findings: Secondary | ICD-10-CM | POA: Diagnosis present

## 2020-11-06 DIAGNOSIS — F439 Reaction to severe stress, unspecified: Secondary | ICD-10-CM

## 2020-11-06 DIAGNOSIS — Z1231 Encounter for screening mammogram for malignant neoplasm of breast: Secondary | ICD-10-CM

## 2020-11-06 DIAGNOSIS — R319 Hematuria, unspecified: Secondary | ICD-10-CM | POA: Diagnosis not present

## 2020-11-06 DIAGNOSIS — K219 Gastro-esophageal reflux disease without esophagitis: Secondary | ICD-10-CM

## 2020-11-06 DIAGNOSIS — Z124 Encounter for screening for malignant neoplasm of cervix: Secondary | ICD-10-CM | POA: Insufficient documentation

## 2020-11-06 DIAGNOSIS — Z1151 Encounter for screening for human papillomavirus (HPV): Secondary | ICD-10-CM | POA: Insufficient documentation

## 2020-11-06 DIAGNOSIS — Z0000 Encounter for general adult medical examination without abnormal findings: Secondary | ICD-10-CM

## 2020-11-06 DIAGNOSIS — Z8742 Personal history of other diseases of the female genital tract: Secondary | ICD-10-CM

## 2020-11-06 DIAGNOSIS — E559 Vitamin D deficiency, unspecified: Secondary | ICD-10-CM | POA: Diagnosis not present

## 2020-11-06 DIAGNOSIS — Z13220 Encounter for screening for lipoid disorders: Secondary | ICD-10-CM

## 2020-11-06 NOTE — Assessment & Plan Note (Signed)
Physical today 11/06/20.  PAP today 11/06/20.  Colonoscopy 07/2016.  Recommended f/u in 5 years.  Schedule mammogram today.  Last 08/2019 - Birads I.

## 2020-11-06 NOTE — Progress Notes (Addendum)
[name], female   DOB: 08-Feb-1956, 65 y.o.   MRN: [id number]   Subjective:    [name], female    DOB: 23-Apr-1955, 65 y.o.   MRN: [id number]  This visit occurred during the SARS-CoV-2 public health emergency.  Safety protocols were in place, including screening questions prior to the visit, additional usage of staff PPE, and extensive cleaning of exam room while observing appropriate contact time as indicated for disinfecting solutions.    HPI With past history of GERD.  She comes in today to follow up on this issue as well as for a complete physical exam.  She reports she is doing well.  Feels good.  Stays active.  Has been gardening and going to the gym.  No chest pain or sob with increased activity or exertion.  No acid reflux.  No abdominal pain.  Bowels moving.  On protonix daily.  If watches what she eats and takes protonix, symptoms relatively well controlled.     Past Medical History:  Diagnosis Date  . GERD (gastroesophageal reflux disease)   . 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 Osteoporosis   . Vitamin D deficiency    Past Surgical History:  Procedure Laterality Date  . BIOPSY  07/31/2020   Procedure: BIOPSY;  Surgeon: Rush Landmark Telford Nab., MD;  Location: [place];  Service: Gastroenterology;;  . BREAST EXCISIONAL BIOPSY Left 1989   benign  . BREAST SURGERY  1989   left breast biopsy  . COLONOSCOPY     x 2  . ESOPHAGOGASTRODUODENOSCOPY (EGD) WITH PROPOFOL N/A 05/30/2020   Procedure: ESOPHAGOGASTRODUODENOSCOPY (EGD) WITH PROPOFOL;  Surgeon: Virgel Manifold, MD;  Location: [facility] ENDOS[facility];  Service: Endoscopy;  Laterality: N/A;  . ESOPHAGOGASTRODUODENOSCOPY (EGD) WITH PROPOFOL N/A 07/31/2020   Procedure: ESOPHAGOGASTRODUODENOSCOPY (EGD) WITH PROPOFOL;  Surgeon: Rush Landmark Telford Nab., MD;  Location: [place];  Service: Gastroenterology;  Laterality: N/A;  . EYE SURGERY Bilateral 1999   lasik  . POLYPECTOMY   07/31/2020   Procedure: POLYPECTOMY;  Surgeon: Mansouraty, Telford Nab., MD;  Location: [place];  Service: Gastroenterology;;  . TUBAL LIGATION    . WISDOM TOOTH EXTRACTION     Family History  Problem Relation Age of Onset  . Breast cancer Mother 79       died age 40  . Parkinson's disease Father   . Lymphoma Maternal Grandmother        non-hodgkin's  . Ovarian cancer Neg Hx   . Diabetes Neg Hx   . Colon cancer Neg Hx   . Heart disease Neg Hx    Social History   Socioeconomic History  . Marital status: Married    Spouse name: Not on file  . Number of children: 1  . Years of education: Not on file  . Highest education level: Not on file  Occupational History  . Not on file  Tobacco Use  . Smoking status: Never  . Smokeless tobacco: Never  Vaping Use  . Vaping Use: Never used  Substance and Sexual Activity  . Alcohol use: Not Currently    Comment: occasional  . Drug use: No  . Sexual activity: Yes    Birth control/protection: Surgical, Post-menopausal    Comment: Tubal Ligation  Other Topics Concern  . Not on file  Social History Narrative  . Not on file   Social Determinants of Health   Financial Resource Strain: Low Risk   . Difficulty of Paying Living Expenses: Not hard at  all  Food Insecurity: No Food Insecurity  . Worried About Charity fundraiser in the Last Year: Never true  . Ran Out of Food in the Last Year: Never true  Transportation Needs: No Transportation Needs  . Lack of Transportation (Medical): No  . Lack of Transportation (Non-Medical): No  Physical Activity: Insufficiently Active  . Days of Exercise per Week: 2 days  . Minutes of Exercise per Session: 60 min  Stress: No Stress Concern Present  . Feeling of Stress : Not at all  Social Connections: Unknown  . Frequency of Communication with Friends and Family: More than three times a week  . Frequency of Social Gatherings with Friends and Family: More than three times a week  . Attends  Religious Services: Not on file  . Active Member of Clubs or Organizations: Not on file  . Attends Archivist Meetings: Not on file  . Marital Status: Not on file    Review of Systems  Constitutional:  Negative for appetite change and unexpected weight change.  HENT:  Negative for congestion, sinus pressure and sore throat.   Eyes:  Negative for pain and visual disturbance.  Respiratory:  Negative for cough, chest tightness and shortness of breath.   Cardiovascular:  Negative for chest pain, palpitations and leg swelling.  Gastrointestinal:  Negative for abdominal pain, diarrhea, nausea and vomiting.  Genitourinary:  Negative for difficulty urinating and dysuria.  Musculoskeletal:  Negative for joint swelling and myalgias.  Skin:  Negative for color change and rash.  Neurological:  Negative for dizziness, light-headedness and headaches.  Hematological:  Negative for adenopathy. Does not bruise/bleed easily.  Psychiatric/Behavioral:  Negative for agitation and dysphoric mood.       Objective:     BP 120/70   Pulse 90   Temp 98 F (36.7 C)   Resp 16   Ht '5\' 1"'$  (1.549 m)   Wt 128 lb (58.1 kg)   LMP  (LMP Unknown)   SpO2 98%   BMI 24.19 kg/m  Wt Readings from Last 3 Encounters:  11/06/20 128 lb (58.1 kg)  08/20/20 135 lb (61.2 kg)  08/04/20 135 lb 3.2 oz (61.3 kg)    Physical Exam Vitals reviewed.  Constitutional:      General: She is not in acute distress.    Appearance: Normal appearance. She is well-developed.  HENT:     Head: Normocephalic and atraumatic.     Right Ear: External ear normal.     Left Ear: External ear normal.  Eyes:     General: No scleral icterus.       Right eye: No discharge.        Left eye: No discharge.     Conjunctiva/sclera: Conjunctivae normal.  Neck:     Thyroid: No thyromegaly.  Cardiovascular:     Rate and Rhythm: Normal rate and regular rhythm.  Pulmonary:     Effort: No tachypnea, accessory muscle usage or respiratory  distress.     Breath sounds: Normal breath sounds. No decreased breath sounds or wheezing.  Chest:  Breasts:    Right: No inverted nipple, mass, nipple discharge or tenderness (no axillary adenopathy).     Left: No inverted nipple, mass, nipple discharge or tenderness (no axilarry adenopathy).  Abdominal:     General: Bowel sounds are normal.     Palpations: Abdomen is soft.     Tenderness: There is no abdominal tenderness.  Genitourinary:    Comments: Normal external genitalia.  Vaginal  vault without lesions.  Cervix identified.  Pap smear performed.  Could not appreciate any adnexal masses or tenderness.   Musculoskeletal:        General: No swelling or tenderness.     Cervical back: Neck supple.  Lymphadenopathy:     Cervical: No cervical adenopathy.  Skin:    Findings: No erythema or rash.  Neurological:     Mental Status: She is alert and oriented to person, place, and time.  Psychiatric:        Mood and Affect: Mood normal.        Behavior: Behavior normal.     Outpatient Encounter Medications as of 11/06/2020  Medication Sig  . pantoprazole (PROTONIX) 40 MG tablet Take 40 mg by mouth daily.  . [DISCONTINUED] pantoprazole (PROTONIX) 40 MG tablet Take 1 tablet (40 mg total) by mouth 2 (two) times daily for 30 days, THEN 1 tablet (40 mg total) daily.  . Ascorbic Acid (VITAMIN C) 1000 MG tablet Take 1,000 mg by mouth daily.  . calcium carbonate (OS-CAL - DOSED IN MG OF ELEMENTAL CALCIUM) 1250 (500 Ca) MG tablet Take 1 tablet by mouth daily with breakfast.  . Multiple Vitamins-Minerals (MULTIVITAMIN PO) Take 1 tablet by mouth daily.  Marland Kitchen triamcinolone cream (KENALOG) 0.1 % Apply 1 application topically 2 (two) times daily. (Patient taking differently: Apply 1 application topically 2 (two) times daily as needed (irritation).)  . Vitamin D, Cholecalciferol, 25 MCG (1000 UT) CAPS Take 1,000 Units by mouth daily.  Marland Kitchen zinc gluconate 50 MG tablet Take 50 mg by mouth daily.  .  [DISCONTINUED] pantoprazole (PROTONIX) 20 MG tablet Take 1 tablet (20 mg total) by mouth daily.   No facility-administered encounter medications on file as of 11/06/2020.     Lab Results  Component Value Date   WBC 6.8 08/24/2019   HGB 13.4 08/24/2019   HCT 40.2 08/24/2019   PLT 324.0 08/24/2019   GLUCOSE 96 08/24/2019   CHOL 185 08/24/2019   TRIG 105.0 08/24/2019   HDL 69.20 08/24/2019   LDLCALC 95 08/24/2019   ALT 13 08/24/2019   AST 17 08/24/2019   NA 140 08/24/2019   K 4.3 08/24/2019   CL 101 08/24/2019   CREATININE 0.77 08/24/2019   BUN 11 08/24/2019   CO2 29 08/24/2019   TSH 1.69 08/24/2019       Assessment & Plan:   Problem List Items Addressed This Visit     Gastroesophageal reflux disease    Controlled if watches what she eats.  Taking protonix daily.  Follow.  Saw GI. Recommended f/u in one year.  07/31/20 EGD.        Relevant Medications   pantoprazole (PROTONIX) 40 MG tablet   Other Relevant Orders   CBC with Differential/Platelet   Comprehensive metabolic panel   Healthcare maintenance    Physical today 11/06/20.  PAP today 11/06/20.  Colonoscopy 07/2016.  Recommended f/u in 5 years.  Schedule mammogram today.  Last 08/2019 - [facility] I.       Hematuria    Urinalysis 04/2020 - no red blood cells.        History of abnormal cervical Pap smear    08/07/18 - pap - negative with negative HPV.  Repeat pap today.       Stress    Overall appears to be handling things well.        Vitamin D deficiency    Continue vitamin D supplements.        Other  Visit Diagnoses     Screening cholesterol level    -  Primary   Relevant Orders   Lipid panel   TSH   Visit for screening mammogram       Relevant Orders   MM 3D SCREEN BREAST BILATERAL   Cervical cancer screening       Relevant Orders   Cytology - PAP( [facility])        Einar Pheasant, MD

## 2020-11-09 ENCOUNTER — Encounter: Payer: Self-pay | Admitting: Internal Medicine

## 2020-11-09 NOTE — Assessment & Plan Note (Signed)
Controlled if watches what she eats.  Taking protonix daily.  Follow.  Saw GI. Recommended f/u in one year.  07/31/20 EGD.

## 2020-11-09 NOTE — Assessment & Plan Note (Signed)
Urinalysis 04/2020 - no red blood cells.

## 2020-11-09 NOTE — Assessment & Plan Note (Signed)
Continue vitamin D supplements.  

## 2020-11-09 NOTE — Assessment & Plan Note (Signed)
08/07/18 - pap - negative with negative HPV.  Repeat pap today.

## 2020-11-09 NOTE — Assessment & Plan Note (Signed)
Overall appears to be handling things well.   

## 2020-11-12 LAB — CYTOLOGY - PAP
Comment: NEGATIVE
Diagnosis: NEGATIVE
High risk HPV: NEGATIVE

## 2020-11-12 NOTE — Addendum Note (Signed)
Addended by: Alisa Graff on: 11/12/2020 03:37 AM   Modules accepted: Level of Service

## 2020-11-13 ENCOUNTER — Other Ambulatory Visit (INDEPENDENT_AMBULATORY_CARE_PROVIDER_SITE_OTHER): Payer: Medicare Other

## 2020-11-13 ENCOUNTER — Ambulatory Visit: Payer: Medicare Other | Admitting: Gastroenterology

## 2020-11-13 ENCOUNTER — Other Ambulatory Visit: Payer: Self-pay

## 2020-11-13 DIAGNOSIS — K219 Gastro-esophageal reflux disease without esophagitis: Secondary | ICD-10-CM

## 2020-11-13 DIAGNOSIS — Z13220 Encounter for screening for lipoid disorders: Secondary | ICD-10-CM | POA: Diagnosis not present

## 2020-11-13 LAB — LIPID PANEL
Cholesterol: 173 mg/dL (ref 0–200)
HDL: 70.7 mg/dL (ref >39.00)
LDL Cholesterol: 89 mg/dL (ref 0–99)
NonHDL: 101.83
Total CHOL/HDL Ratio: 2
Triglycerides: 65 mg/dL (ref 0.0–149.0)
VLDL: 13 mg/dL (ref 0.0–40.0)

## 2020-11-13 LAB — CBC WITH DIFFERENTIAL/PLATELET
Basophils Absolute: 0.1 10*3/uL (ref 0.0–0.1)
Basophils Relative: 0.8 % (ref 0.0–3.0)
Eosinophils Absolute: 0.1 10*3/uL (ref 0.0–0.7)
Eosinophils Relative: 1.6 % (ref 0.0–5.0)
HCT: 38.6 % (ref 36.0–46.0)
Hemoglobin: 12.7 g/dL (ref 12.0–15.0)
Lymphocytes Relative: 38.4 % (ref 12.0–46.0)
Lymphs Abs: 2.6 10*3/uL (ref 0.7–4.0)
MCHC: 32.9 g/dL (ref 30.0–36.0)
MCV: 92 fl (ref 78.0–100.0)
Monocytes Absolute: 0.6 10*3/uL (ref 0.1–1.0)
Monocytes Relative: 8.5 % (ref 3.0–12.0)
Neutro Abs: 3.5 10*3/uL (ref 1.4–7.7)
Neutrophils Relative %: 50.7 % (ref 43.0–77.0)
Platelets: 311 10*3/uL (ref 150.0–400.0)
RBC: 4.2 Mil/uL (ref 3.87–5.11)
RDW: 14.1 % (ref 11.5–15.5)
WBC: 6.8 10*3/uL (ref 4.0–10.5)

## 2020-11-13 LAB — COMPREHENSIVE METABOLIC PANEL
ALT: 12 U/L (ref 0–35)
AST: 15 U/L (ref 0–37)
Albumin: 4.4 g/dL (ref 3.5–5.2)
Alkaline Phosphatase: 86 U/L (ref 39–117)
BUN: 18 mg/dL (ref 6–23)
CO2: 28 mEq/L (ref 19–32)
Calcium: 9.8 mg/dL (ref 8.4–10.5)
Chloride: 102 mEq/L (ref 96–112)
Creatinine, Ser: 0.91 mg/dL (ref 0.40–1.20)
GFR: 65.52 mL/min (ref >60.00)
Glucose, Bld: 90 mg/dL (ref 70–99)
Potassium: 4.3 mEq/L (ref 3.5–5.1)
Sodium: 138 mEq/L (ref 135–145)
Total Bilirubin: 0.4 mg/dL (ref 0.2–1.2)
Total Protein: 6.8 g/dL (ref 6.0–8.3)

## 2020-11-13 LAB — TSH: TSH: 1.43 u[IU]/mL (ref 0.35–5.50)

## 2020-11-28 ENCOUNTER — Other Ambulatory Visit: Payer: Self-pay

## 2020-11-28 ENCOUNTER — Ambulatory Visit
Admission: RE | Admit: 2020-11-28 | Discharge: 2020-11-28 | Disposition: A | Discharge status: Home / Self Care | Payer: Medicare Other | Source: Ambulatory Visit | Attending: Internal Medicine | Admitting: Internal Medicine

## 2020-11-28 DIAGNOSIS — Z1231 Encounter for screening mammogram for malignant neoplasm of breast: Secondary | ICD-10-CM | POA: Insufficient documentation

## 2021-05-06 ENCOUNTER — Ambulatory Visit: Payer: Medicare Other | Admitting: Internal Medicine

## 2021-05-15 ENCOUNTER — Ambulatory Visit (INDEPENDENT_AMBULATORY_CARE_PROVIDER_SITE_OTHER): Payer: Medicare Other | Admitting: Internal Medicine

## 2021-05-15 ENCOUNTER — Encounter: Payer: Self-pay | Admitting: Internal Medicine

## 2021-05-15 ENCOUNTER — Other Ambulatory Visit: Payer: Self-pay

## 2021-05-15 DIAGNOSIS — Z8742 Personal history of other diseases of the female genital tract: Secondary | ICD-10-CM | POA: Diagnosis not present

## 2021-05-15 DIAGNOSIS — E559 Vitamin D deficiency, unspecified: Secondary | ICD-10-CM | POA: Diagnosis not present

## 2021-05-15 DIAGNOSIS — F439 Reaction to severe stress, unspecified: Secondary | ICD-10-CM | POA: Diagnosis not present

## 2021-05-15 DIAGNOSIS — K219 Gastro-esophageal reflux disease without esophagitis: Secondary | ICD-10-CM | POA: Diagnosis not present

## 2021-05-15 MED ORDER — FAMOTIDINE 20 MG PO TABS: 20.0000 mg | ORAL_TABLET | Freq: Every day | ORAL | 1 refills | Status: DC

## 2021-05-15 NOTE — Progress Notes (Signed)
Patient ID: NERVA PALMBERG, female   DOB: 1955-04-18, 66 y.o.   MRN: [id number]   Subjective:    Patient ID: CITLALY KAVANAGH, female    DOB: Jul 11, 1955, 66 y.o.   MRN: [id number]  This visit occurred during the SARS-CoV-2 public health emergency.  Safety protocols were in place, including screening questions prior to the visit, additional usage of staff PPE, and extensive cleaning of exam room while observing appropriate contact time as indicated for disinfecting solutions.   Patient here for a scheduled follow up.   Chief Complaint  Patient presents with   Gastroesophageal Reflux   .   HPI Here to follow up regarding GERD. Was taking protonix.  Is followed by GI.  Last EGD 07/2020.  Still had the globus sensation - mouth. Felt like protonix was aggravating.  Off protonix now.  Taking pepcid.  Feels - symptoms - better and controlled.  No chest pain or sob reported.  No increased cough or congestion.  No abdominal pain reported.     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   Osteoporosis    Vitamin D deficiency    Past Surgical History:  Procedure Laterality Date   BIOPSY  07/31/2020   Procedure: BIOPSY;  Surgeon: Lemar Lofty., MD;  Location: [facility] ENDOSCOPY;  Service: Gastroenterology;;   BREAST EXCISIONAL BIOPSY Left 1989   benign   BREAST SURGERY  1989   left breast biopsy   COLONOSCOPY     x 2   ESOPHAGOGASTRODUODENOSCOPY (EGD) WITH PROPOFOL N/A 05/30/2020   Procedure: ESOPHAGOGASTRODUODENOSCOPY (EGD) WITH PROPOFOL;  Surgeon: Pasty Spillers, MD;  Location: [facility] ENDOSCOPY;  Service: Endoscopy;  Laterality: N/A;   ESOPHAGOGASTRODUODENOSCOPY (EGD) WITH PROPOFOL N/A 07/31/2020   Procedure: ESOPHAGOGASTRODUODENOSCOPY (EGD) WITH PROPOFOL;  Surgeon: Meridee Score Netty Starring., MD;  Location: [facility] ENDOSCOPY;  Service: Gastroenterology;  Laterality: N/A;   EYE SURGERY Bilateral 1999   lasik    POLYPECTOMY  07/31/2020   Procedure: POLYPECTOMY;  Surgeon: Mansouraty, Netty Starring., MD;  Location: [facility] ENDOSCOPY;  Service: Gastroenterology;;   TUBAL LIGATION     WISDOM TOOTH EXTRACTION     Family History  Problem Relation Age of Onset   Breast cancer Mother 77       died age 68   Parkinson's disease Father    Lymphoma Maternal Grandmother        non-hodgkin's   Ovarian cancer Neg Hx    Diabetes Neg Hx    Colon cancer Neg Hx    Heart disease Neg Hx    Social History   Socioeconomic History   Marital status: Divorced    Spouse name: Not on file   Number of children: 1   Years of education: Not on file   Highest education level: Not on file  Occupational History   Not on file  Tobacco Use   Smoking status: Never   Smokeless tobacco: Never  Vaping Use   Vaping Use: Never used  Substance and Sexual Activity   Alcohol use: Not Currently    Comment: occasional   Drug use: No   Sexual activity: Yes    Birth control/protection: Surgical, Post-menopausal    Comment: Tubal Ligation  Other Topics Concern   Not on file  Social History Narrative   Not on file   Social Determinants of Health   Financial Resource Strain: Low Risk    Difficulty of Paying Living Expenses: Not  hard at all  Food Insecurity: No Food Insecurity   Worried About Programme researcher, broadcasting/film/video in the Last Year: Never true   Ran Out of Food in the Last Year: Never true  Transportation Needs: No Transportation Needs   Lack of Transportation (Medical): No   Lack of Transportation (Non-Medical): No  Physical Activity: Insufficiently Active   Days of Exercise per Week: 2 days   Minutes of Exercise per Session: 60 min  Stress: No Stress Concern Present   Feeling of Stress : Not at all  Social Connections: Unknown   Frequency of Communication with Friends and Family: More than three times a week   Frequency of Social Gatherings with Friends and Family: More than three times a week   Attends Religious Services:  Not on Scientist, clinical (histocompatibility and immunogenetics) or Organizations: Not on file   Attends Banker Meetings: Not on file   Marital Status: Not on file     Review of Systems  Constitutional:  Negative for appetite change and unexpected weight change.  HENT:  Negative for congestion and sinus pressure.   Respiratory:  Negative for cough, chest tightness and shortness of breath.   Cardiovascular:  Negative for chest pain, palpitations and leg swelling.  Gastrointestinal:  Negative for abdominal pain, diarrhea, nausea and vomiting.  Genitourinary:  Negative for difficulty urinating and dysuria.  Musculoskeletal:  Negative for joint swelling and myalgias.  Skin:  Negative for color change and rash.  Neurological:  Negative for dizziness, light-headedness and headaches.  Psychiatric/Behavioral:  Negative for agitation and dysphoric mood.       Objective:     BP 132/72   Pulse 80   Temp 97.9 F (36.6 C)   Resp 16   Ht 5\' 1"  (1.549 m)   Wt 125 lb 12.8 oz (57.1 kg)   LMP  (LMP Unknown)   SpO2 99%   BMI 23.77 kg/m  Wt Readings from Last 3 Encounters:  05/15/21 125 lb 12.8 oz (57.1 kg)  11/06/20 128 lb (58.1 kg)  08/20/20 135 lb (61.2 kg)    Physical Exam Vitals reviewed.  Constitutional:      General: She is not in acute distress.    Appearance: Normal appearance.  HENT:     Head: Normocephalic and atraumatic.     Right Ear: External ear normal.     Left Ear: External ear normal.  Eyes:     General: No scleral icterus.       Right eye: No discharge.        Left eye: No discharge.     Conjunctiva/sclera: Conjunctivae normal.  Neck:     Thyroid: No thyromegaly.  Cardiovascular:     Rate and Rhythm: Normal rate and regular rhythm.  Pulmonary:     Effort: No respiratory distress.     Breath sounds: Normal breath sounds. No wheezing.  Abdominal:     General: Bowel sounds are normal.     Palpations: Abdomen is soft.     Tenderness: There is no abdominal tenderness.   Musculoskeletal:        General: No swelling or tenderness.     Cervical back: Neck supple. No tenderness.  Lymphadenopathy:     Cervical: No cervical adenopathy.  Skin:    Findings: No erythema or rash.  Neurological:     Mental Status: She is alert.  Psychiatric:        Mood and Affect: Mood normal.  Behavior: Behavior normal.     Outpatient Encounter Medications as of 05/15/2021  Medication Sig   Ascorbic Acid (VITAMIN C) 1000 MG tablet Take 1,000 mg by mouth daily.   calcium carbonate (OS-CAL - DOSED IN MG OF ELEMENTAL CALCIUM) 1250 (500 Ca) MG tablet Take 1 tablet by mouth daily with breakfast.   Multiple Vitamins-Minerals (MULTIVITAMIN PO) Take 1 tablet by mouth daily.   triamcinolone cream (KENALOG) 0.1 % Apply 1 application topically 2 (two) times daily. (Patient taking differently: Apply 1 application. topically 2 (two) times daily as needed (irritation).)   Vitamin D, Cholecalciferol, 25 MCG (1000 UT) CAPS Take 1,000 Units by mouth daily.   zinc gluconate 50 MG tablet Take 50 mg by mouth daily.   [DISCONTINUED] famotidine (PEPCID) 20 MG tablet Take 20 mg by mouth daily.   famotidine (PEPCID) 20 MG tablet Take 1 tablet (20 mg total) by mouth daily.   [DISCONTINUED] pantoprazole (PROTONIX) 40 MG tablet Take 40 mg by mouth daily.   No facility-administered encounter medications on file as of 05/15/2021.     Lab Results  Component Value Date   WBC 6.8 11/13/2020   HGB 12.7 11/13/2020   HCT 38.6 11/13/2020   PLT 311.0 11/13/2020   GLUCOSE 90 11/13/2020   CHOL 173 11/13/2020   TRIG 65.0 11/13/2020   HDL 70.70 11/13/2020   LDLCALC 89 11/13/2020   ALT 12 11/13/2020   AST 15 11/13/2020   NA 138 11/13/2020   K 4.3 11/13/2020   CL 102 11/13/2020   CREATININE 0.91 11/13/2020   BUN 18 11/13/2020   CO2 28 11/13/2020   TSH 1.43 11/13/2020    MM 3D SCREEN BREAST BILATERAL  Result Date: 12/03/2020 CLINICAL DATA:  Screening. EXAM: DIGITAL SCREENING BILATERAL  MAMMOGRAM WITH TOMOSYNTHESIS AND CAD TECHNIQUE: Bilateral screening digital craniocaudal and mediolateral oblique mammograms were obtained. Bilateral screening digital breast tomosynthesis was performed. The images were evaluated with computer-aided detection. COMPARISON:  Previous exam(s). ACR Breast Density Category b: There are scattered areas of fibroglandular density. FINDINGS: There are no findings suspicious for malignancy. IMPRESSION: No mammographic evidence of malignancy. A result letter of this screening mammogram will be mailed directly to the patient. RECOMMENDATION: Screening mammogram in one year. (Code:SM-B-01Y) BI-RADS CATEGORY  1: Negative. Electronically Signed   By: Emmaline Kluver M.D.   On: 12/03/2020 14:57      Assessment & Plan:   Problem List Items Addressed This Visit     Gastroesophageal reflux disease    Off protonix.  On pepcid.  Symptoms improved as outlined.  Has seen GI as outlined.  EGD 06/2020.  Recommended f/u in one year.  Follow.       Relevant Medications   famotidine (PEPCID) 20 MG tablet   History of abnormal cervical Pap smear    PAP 11/06/20 - negative with negative HPV.       Stress    Overall appears to be handling things well.  Follow.       Vitamin D deficiency    Continue vitamin d supplements.         Dale Laurie, MD

## 2021-05-16 ENCOUNTER — Encounter: Payer: Self-pay | Admitting: Internal Medicine

## 2021-05-16 NOTE — Assessment & Plan Note (Signed)
Continue vitamin d supplements.  ?

## 2021-05-16 NOTE — Assessment & Plan Note (Signed)
Overall appears to be handling things well.  Follow.  ?

## 2021-05-16 NOTE — Assessment & Plan Note (Signed)
Off protonix.  On pepcid.  Symptoms improved as outlined.  Has seen GI as outlined.  EGD 06/2020.  Recommended f/u in one year.  Follow.  ?

## 2021-05-16 NOTE — Assessment & Plan Note (Signed)
PAP 11/06/20 - negative with negative HPV.  

## 2021-05-23 ENCOUNTER — Encounter: Payer: Self-pay | Admitting: Internal Medicine

## 2021-05-25 ENCOUNTER — Other Ambulatory Visit: Payer: Self-pay

## 2021-05-25 DIAGNOSIS — K219 Gastro-esophageal reflux disease without esophagitis: Secondary | ICD-10-CM

## 2021-05-25 DIAGNOSIS — D132 Benign neoplasm of duodenum: Secondary | ICD-10-CM

## 2021-08-20 ENCOUNTER — Telehealth: Payer: Self-pay | Admitting: Internal Medicine

## 2021-08-20 NOTE — Telephone Encounter (Signed)
Copied from [place] [id number]. Topic: Medicare AWV >> Aug 20, 2021 11:26 AM Devoria Glassing wrote: Reason for CRM: Left message for patient to schedule Annual Wellness Visit.  Please schedule with Nurse Health Advisor Denisa O'Brien-Blaney, LPN at [facility].  Please call [phone] ask for [facility]

## 2021-09-03 ENCOUNTER — Telehealth: Payer: Self-pay | Admitting: Internal Medicine

## 2021-09-03 NOTE — Telephone Encounter (Signed)
Copied from [place] [id number]. Topic: Medicare AWV >> Sep 03, 2021 10:35 AM Devoria Glassing wrote: Reason for CRM: Left message for patient to schedule Annual Wellness Visit.  Please schedule with Nurse Health Advisor Denisa O'Brien-Blaney, LPN at [facility]. This appt can be telephone or office visit.  Please call [phone] ask for [facility]

## 2021-09-08 ENCOUNTER — Ambulatory Visit (INDEPENDENT_AMBULATORY_CARE_PROVIDER_SITE_OTHER): Payer: Medicare Other

## 2021-09-08 VITALS — Ht 61.0 in | Wt 125.0 lb

## 2021-09-08 DIAGNOSIS — Z0000 Encounter for general adult medical examination without abnormal findings: Secondary | ICD-10-CM

## 2021-09-08 NOTE — Progress Notes (Signed)
Subjective:   Autumn Wolfe is a 66 y.o. female who presents for Medicare Annual (Subsequent) preventive examination.  Review of Systems    No ROS.  Medicare Wellness Virtual Visit.  Visual/audio telehealth visit, UTA vital signs.   See social history for additional risk factors.   Cardiac Risk Factors include: advanced age (>1mn, >>54women)     Objective:    Today's Vitals   09/08/21 1331  Weight: 125 lb (56.7 kg)  Height: '5\' 1"'$  (1.549 m)   Body mass index is 23.62 kg/m.     09/08/2021    2:23 PM 08/20/2020    3:28 PM 07/31/2020   10:42 AM 05/30/2020    9:12 AM  Advanced Directives  Does Patient Have a Medical Advance Directive? Yes Yes Yes Yes  Type of AParamedicof [place]Living will [place]Living will [place] will Living will  Does patient want to make changes to medical advance directive? No - Patient declined No - Patient declined    Copy of HUniopolisin Chart? No - copy requested No - copy requested No - copy requested     Current Medications (verified) Outpatient Encounter Medications as of 09/08/2021  Medication Sig   Ascorbic Acid (VITAMIN C) 1000 MG tablet Take 1,000 mg by mouth daily.   calcium carbonate (OS-CAL - DOSED IN MG OF ELEMENTAL CALCIUM) 1250 (500 Ca) MG tablet Take 1 tablet by mouth daily with breakfast.   famotidine (PEPCID) 20 MG tablet Take 1 tablet (20 mg total) by mouth daily.   Multiple Vitamins-Minerals (MULTIVITAMIN PO) Take 1 tablet by mouth daily.   triamcinolone cream (KENALOG) 0.1 % Apply 1 application topically 2 (two) times daily. (Patient taking differently: Apply 1 application. topically 2 (two) times daily as needed (irritation).)   Vitamin D, Cholecalciferol, 25 MCG (1000 UT) CAPS Take 1,000 Units by mouth daily.   zinc gluconate 50 MG tablet Take 50 mg by mouth daily.   No facility-administered encounter medications on file as of  09/08/2021.    Allergies (verified) Patient has no known allergies.   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   Osteoporosis    Vitamin D deficiency    Past Surgical History:  Procedure Laterality Date   BIOPSY  07/31/2020   Procedure: BIOPSY;  Surgeon: MIrving Copas, MD;  [place]  Service: Gastroenterology;;   BREAST EXCISIONAL BIOPSY Left 1989   benign   [place]  left breast biopsy   COLONOSCOPY     x 2   ESOPHAGOGASTRODUODENOSCOPY (EGD) WITH PROPOFOL N/A 05/30/2020   Procedure: ESOPHAGOGASTRODUODENOSCOPY (EGD) WITH PROPOFOL;  Surgeon: TVirgel Manifold MD;  Location: [facility]COPY;  Service: Endoscopy;  Laterality: N/A;   ESOPHAGOGASTRODUODENOSCOPY (EGD) WITH PROPOFOL N/A 07/31/2020   Procedure: ESOPHAGOGASTRODUODENOSCOPY (EGD) WITH PROPOFOL;  Surgeon: MRush LandmarkGTelford Nab, MD;  [place]  Service: Gastroenterology;  Laterality: N/A;   EYE SURGERY Bilateral 1999   lasik   POLYPECTOMY  07/31/2020   Procedure: POLYPECTOMY;  Surgeon: Mansouraty, GTelford Nab, MD;  [facility];  Service: Gastroenterology;;   TUBAL LIGATION     WISDOM TOOTH EXTRACTION     Family History  Problem Relation Age of Onset   Breast cancer Mother 647      died age 68  Parkinson's disease Father    Lymphoma  Maternal Grandmother        non-hodgkin's   Ovarian cancer Neg Hx    Diabetes Neg Hx    Colon cancer Neg Hx    Heart disease Neg Hx    Social History   Socioeconomic History   Marital status: Divorced    Spouse name: Not on file   Number of children: 1   Years of education: Not on file   Highest education level: Not on file  Occupational History   Not on file  Tobacco Use   Smoking status: Never   Smokeless tobacco: Never  Vaping Use   Vaping Use: Never used  Substance and Sexual Activity   Alcohol use: Not Currently     Comment: occasional   Drug use: No   Sexual activity: Yes    Birth control/protection: Surgical, Post-menopausal    Comment: Tubal Ligation  Other Topics Concern   Not on file  Social History Narrative   Not on file   Social Determinants of Health   Financial Resource Strain: Low Risk  (09/08/2021)   Overall Financial Resource Strain (CARDIA)    Difficulty of Paying Living Expenses: Not hard at all  Food Insecurity: No Food Insecurity (09/08/2021)   Hunger Vital Sign    Worried About Running Out of Food in the Last Year: Never true    Ran Out of Food in the Last Year: Never true  Transportation Needs: No Transportation Needs (09/08/2021)   PRAPARE - Hydrologist (Medical): No    Lack of Transportation (Non-Medical): No  Physical Activity: Insufficiently Active (09/08/2021)   Exercise Vital Sign    Days of Exercise per Week: 2 days    Minutes of Exercise per Session: 60 min  Stress: No Stress Concern Present (09/08/2021)   [place]    Feeling of Stress : Not at all  Social Connections: Unknown (09/08/2021)   Social Connection and Isolation Panel [NHANES]    Frequency of Communication with Friends and Family: More than three times a week    Frequency of Social Gatherings with Friends and Family: More than three times a week    Attends Religious Services: Not on Advertising copywriter or Organizations: Not on file    Attends Archivist Meetings: Not on file    Marital Status: Not on file    Tobacco Counseling Counseling given: Not Answered   Clinical Intake:  Pre-visit preparation completed: Yes        Diabetes: No  How often do you need to have someone help you when you read instructions, pamphlets, or other written materials from your doctor or pharmacy?: 1 - Never  Interpreter Needed?: No    Activities of Daily Living    09/08/2021    1:36 PM  In  your present state of health, do you have any difficulty performing the following activities:  Hearing? 0  Vision? 0  Difficulty concentrating or making decisions? 0  Walking or climbing stairs? 0  Dressing or bathing? 0  Doing errands, shopping? 0  Preparing Food and eating ? N  Using the Toilet? N  In the past six months, have you accidently leaked urine? N  Do you have problems with loss of bowel control? N  Managing your Medications? N  Managing your Finances? N  Housekeeping or managing your Housekeeping? N   Patient Care Team: Einar Pheasant, MD as PCP - General (  Internal Medicine)  Indicate any recent Medical Services you may have received from other than [facility] providers in the past year (date may be approximate).     Assessment:   This is a routine wellness examination for Tangee.  Virtual Visit via Telephone Note  I connected with  [name] on 09/08/21 at  1:30 PM EDT by telephone and verified that I am speaking with the correct person using two identifiers.  Persons participating in the virtual visit: patient/Nurse Health Advisor   I discussed the limitations of performing an evaluation and management service by telehealth. We continued and completed visit with audio only. Some vital signs may be absent or patient reported.   Hearing/Vision screen Hearing Screening - Comments:: Patient is able to hear conversational tones without difficulty. No issues reported.  Vision Screening - Comments:: Followed by [facility] They have seen their ophthalmologist in the last 12 months. Wears readers only.  Dietary issues and exercise activities discussed: Current Exercise Habits: Structured exercise class, Type of exercise: strength training/weights;calisthenics;stretching;treadmill, Time (Minutes): 60, Frequency (Times/Week): 2, Weekly Exercise (Minutes/Week): 120, Intensity: Mild   Goals Addressed             This Visit's Progress    Follow up with  Primary Care Provider       As needed.       Depression Screen    09/08/2021    2:22 PM 08/20/2020    2:57 PM 01/16/2020    8:51 AM 01/16/2020    8:49 AM 08/24/2019    9:22 AM 04/29/2015    3:51 PM 03/14/2014    8:29 AM  PHQ 2/9 Scores  PHQ - 2 Score 0 0 1 1 0 0 0  PHQ- 9 Score   1        Fall Risk    09/08/2021    1:32 PM 08/20/2020    3:29 PM 01/16/2020    8:19 AM 08/24/2019    9:22 AM 04/29/2015    3:51 PM  [place] in the past year? 0 0 0 0 No  Number falls in past yr:  0 0    Injury with Fall?   0    Risk for fall due to :   No Fall Risks    Follow up Falls evaluation completed Falls evaluation completed Falls evaluation completed Falls evaluation completed     [place]: Home free of loose throw rugs in walkways, pet beds, electrical cords, etc? Yes  Adequate lighting in your home to reduce risk of falls? Yes   ASSISTIVE DEVICES UTILIZED TO PREVENT FALLS: Life alert? No  Use of a cane, walker or w/c? No   TIMED UP AND GO: Was the test performed? No .   Cognitive Function:  Patient is alert and oriented x3.      Immunizations  There is no immunization history on file for this patient.  TDAP status: Due, Education has been provided regarding the importance of this vaccine. Advised may receive this vaccine at local pharmacy or Health Dept. Aware to provide a copy of the vaccination record if obtained from local pharmacy or Health Dept. Verbalized acceptance and understanding.  Screening Tests Health Maintenance  Topic Date Due   TETANUS/TDAP  10/30/2021 (Originally 11/18/1974)   Pneumonia Vaccine 29+ Years old (1 - PCV) 05/16/2022 (Originally 11/19/2018)   INFLUENZA VACCINE  09/29/2021   MAMMOGRAM  11/29/2022   COLONOSCOPY (Pts 45-26yr Insurance coverage  will need to be confirmed)  08/19/2026   DEXA SCAN  Completed   HPV VACCINES  Aged Out   COVID-19 Vaccine  Discontinued   Hepatitis C Screening  Discontinued    Zoster Vaccines- Shingrix  Discontinued   Health Maintenance There are no preventive care reminders to display for this patient.  Lung Cancer Screening: (Low Dose CT Chest recommended if Age 30-80 years, 30 pack-year currently smoking OR have quit w/in 15years.) does not qualify.   Hepatitis C Screening: discontinued per patient.   Vision Screening: Recommended annual ophthalmology exams for early detection of glaucoma and other disorders of the eye.  Dental Screening: Recommended annual dental exams for proper oral hygiene  Community Resource Referral / Chronic Care Management: CRR required this visit?  No   CCM required this visit?  No      Plan:   Keep all routine maintenance appointments.   I have personally reviewed and noted the following in the patient's chart:   Medical and social history Use of alcohol, tobacco or illicit drugs  Current medications and supplements including opioid prescriptions.  Functional ability and status Nutritional status Physical activity Advanced directives List of other physicians Hospitalizations, surgeries, and ER visits in previous 12 months Vitals Screenings to include cognitive, depression, and falls Referrals and appointments  In addition, I have reviewed and discussed with patient certain preventive protocols, quality metrics, and best practice recommendations. A written personalized care plan for preventive services as well as general preventive health recommendations were provided to patient.     Varney Biles, LPN   1/43/8887

## 2021-09-08 NOTE — Patient Instructions (Addendum)
  Ms. Lyons , Thank you for taking time to come for your Medicare Wellness Visit. I appreciate your ongoing commitment to your health goals. Please review the following plan we discussed and let me know if I can assist you in the future.   These are the goals we discussed:  Goals      Follow up with Primary Care Provider     As needed.        This is a list of the screening recommended for you and due dates:  Health Maintenance  Topic Date Due   Tetanus Vaccine  10/30/2021*   Pneumonia Vaccine (1 - PCV) 05/16/2022*   Flu Shot  09/29/2021   Mammogram  11/29/2022   Colon Cancer Screening  08/19/2026   DEXA scan (bone density measurement)  Completed   HPV Vaccine  Aged Out   COVID-19 Vaccine  Discontinued   Hepatitis C Screening: USPSTF Recommendation to screen - Ages 65-79 yo.  Discontinued   Zoster (Shingles) Vaccine  Discontinued  *Topic was postponed. The date shown is not the original due date.

## 2021-09-12 IMAGING — MG DIGITAL SCREENING BILAT W/ TOMO W/ CAD
8 series · 8 of 24 positions shown · non-contrast
Comparison: Previous exam(s).

CLINICAL DATA: Screening.

EXAM:
DIGITAL SCREENING BILATERAL MAMMOGRAM WITH TOMO AND CAD

[L MLO synth-2D]
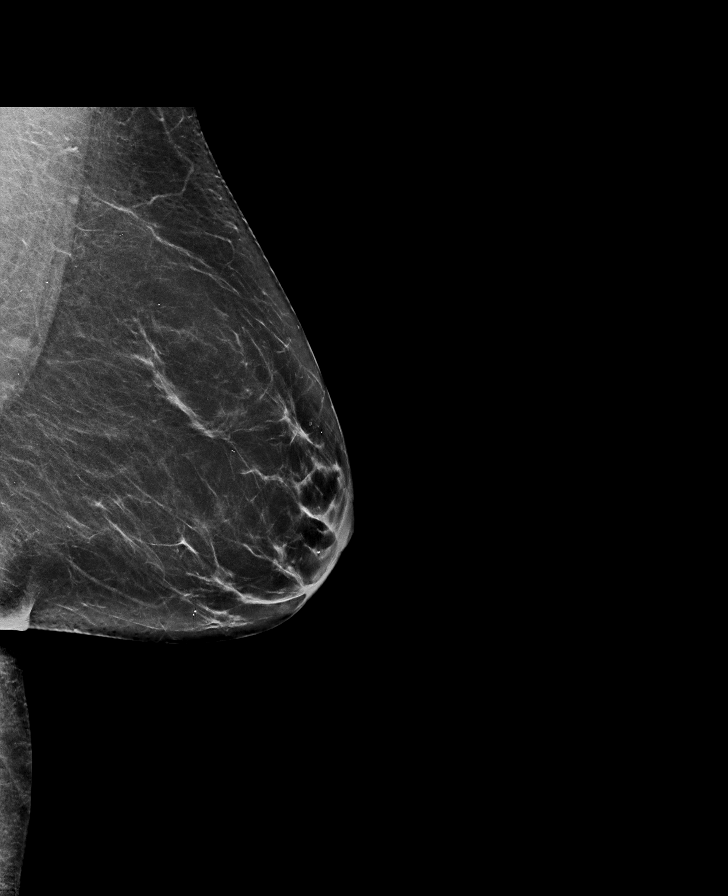

[R MLO synth-2D]
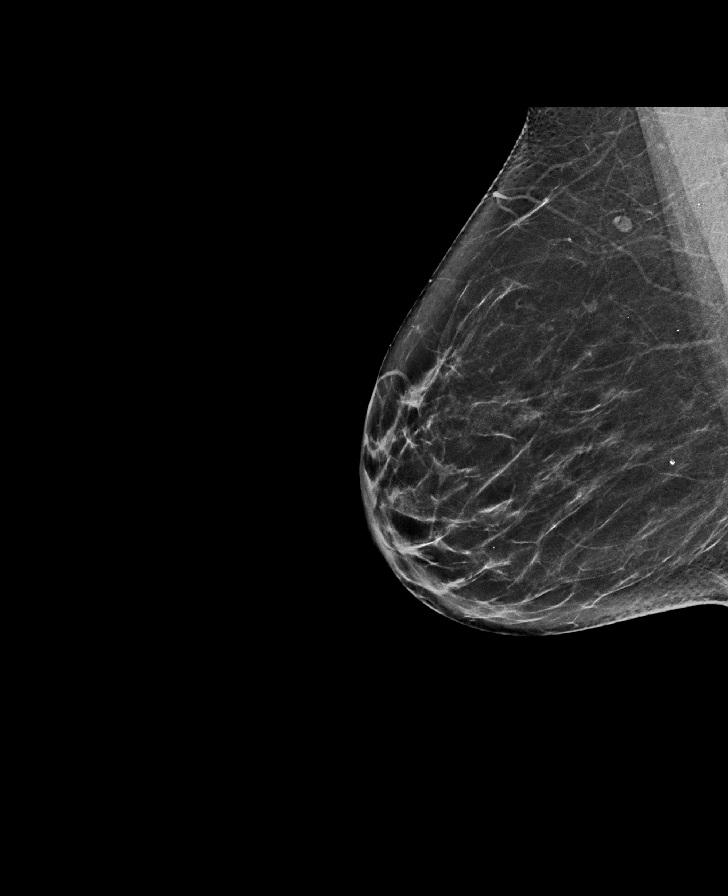

[R CC synth-2D]
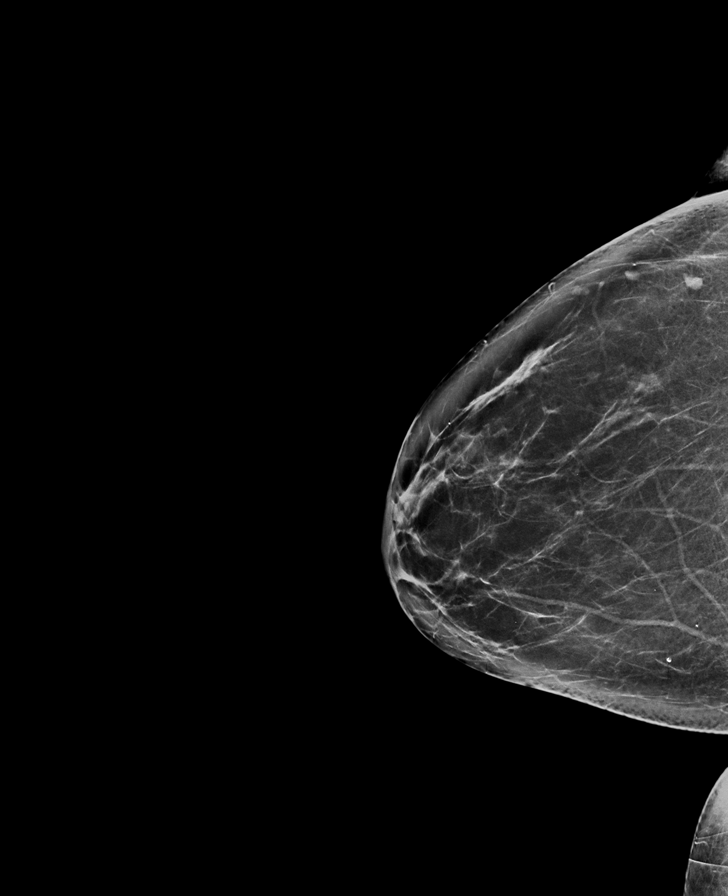

[L CC synth-2D]
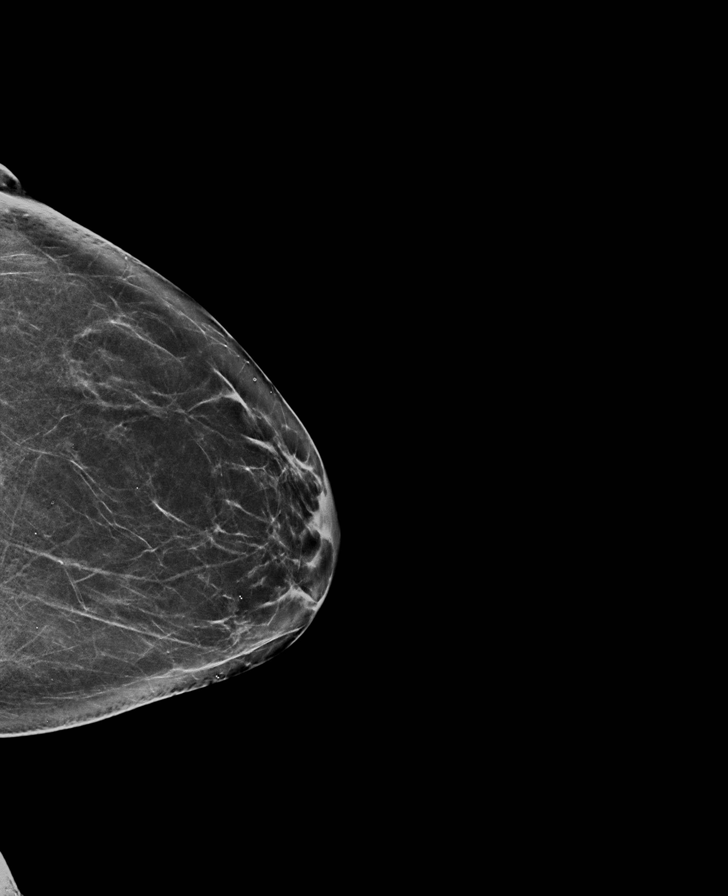

[R CC tomo · tomo slice 35/70.0]
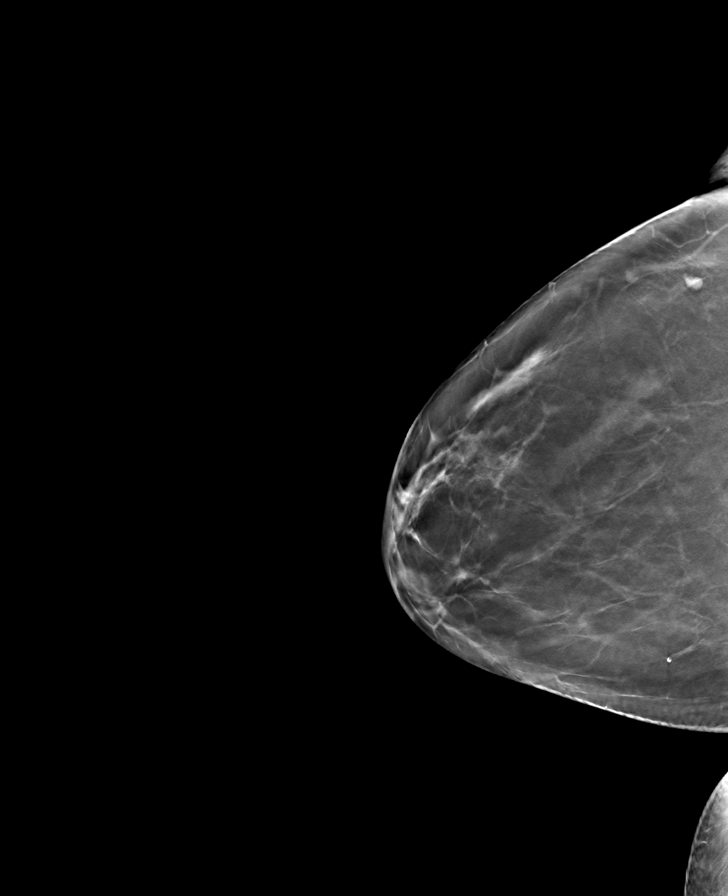

[L CC tomo · tomo slice 35/68.0]
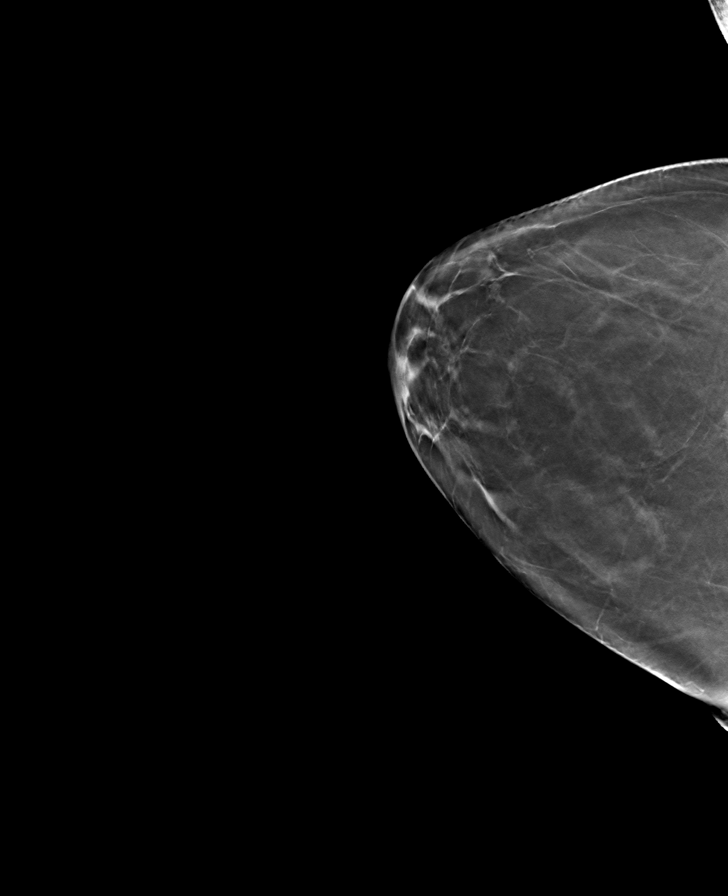

[L MLO tomo · tomo slice 37/72.0]
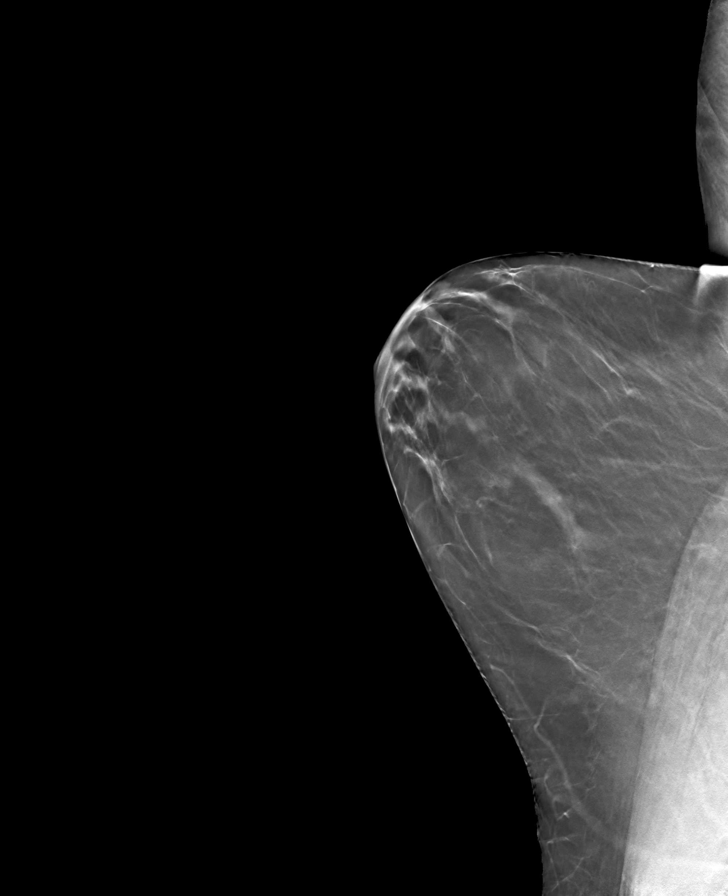

[R MLO tomo · tomo slice 33/66.0]
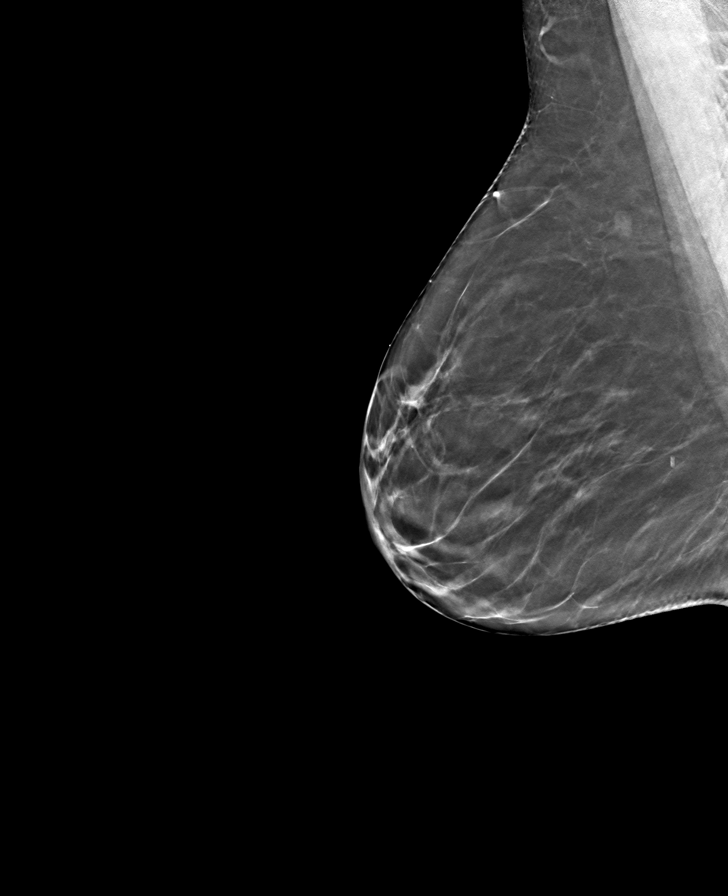

[8 of 24 positions shown; findings below may reference images not displayed]

ACR Breast Density Category b: There are scattered areas of
fibroglandular density.
FINDINGS: There are no findings suspicious for malignancy. Images were
processed with CAD.
IMPRESSION: No mammographic evidence of malignancy. A result letter of this
screening mammogram will be mailed directly to the patient.

RECOMMENDATION:
Screening mammogram in one year. (Code:[id number])

BI-RADS CATEGORY  1: Negative.

## 2021-10-05 ENCOUNTER — Other Ambulatory Visit: Payer: Self-pay | Admitting: Internal Medicine

## 2021-11-06 ENCOUNTER — Encounter: Payer: Self-pay | Admitting: Internal Medicine

## 2021-11-06 ENCOUNTER — Telehealth: Payer: Self-pay

## 2021-11-06 ENCOUNTER — Ambulatory Visit (INDEPENDENT_AMBULATORY_CARE_PROVIDER_SITE_OTHER): Payer: Medicare Other | Admitting: Internal Medicine

## 2021-11-06 VITALS — BP 126/82 | HR 82 | Temp 98.2°F | Ht 61.0 in | Wt 125.2 lb

## 2021-11-06 DIAGNOSIS — Z13220 Encounter for screening for lipoid disorders: Secondary | ICD-10-CM

## 2021-11-06 DIAGNOSIS — F439 Reaction to severe stress, unspecified: Secondary | ICD-10-CM

## 2021-11-06 DIAGNOSIS — Z1329 Encounter for screening for other suspected endocrine disorder: Secondary | ICD-10-CM | POA: Diagnosis not present

## 2021-11-06 DIAGNOSIS — Z0000 Encounter for general adult medical examination without abnormal findings: Secondary | ICD-10-CM

## 2021-11-06 DIAGNOSIS — Z1231 Encounter for screening mammogram for malignant neoplasm of breast: Secondary | ICD-10-CM

## 2021-11-06 DIAGNOSIS — K219 Gastro-esophageal reflux disease without esophagitis: Secondary | ICD-10-CM | POA: Diagnosis not present

## 2021-11-06 DIAGNOSIS — E559 Vitamin D deficiency, unspecified: Secondary | ICD-10-CM

## 2021-11-06 DIAGNOSIS — R319 Hematuria, unspecified: Secondary | ICD-10-CM

## 2021-11-06 LAB — COMPREHENSIVE METABOLIC PANEL
ALT: 18 U/L (ref 0–35)
AST: 20 U/L (ref 0–37)
Albumin: 4.4 g/dL (ref 3.5–5.2)
Alkaline Phosphatase: 95 U/L (ref 39–117)
BUN: 15 mg/dL (ref 6–23)
CO2: 28 mEq/L (ref 19–32)
Calcium: 9.7 mg/dL (ref 8.4–10.5)
Chloride: 100 mEq/L (ref 96–112)
Creatinine, Ser: 0.79 mg/dL (ref 0.40–1.20)
GFR: 77.11 mL/min (ref >60.00)
Glucose, Bld: 85 mg/dL (ref 70–99)
Potassium: 4.1 mEq/L (ref 3.5–5.1)
Sodium: 138 mEq/L (ref 135–145)
Total Bilirubin: 0.4 mg/dL (ref 0.2–1.2)
Total Protein: 7 g/dL (ref 6.0–8.3)

## 2021-11-06 LAB — CBC WITH DIFFERENTIAL/PLATELET
Basophils Absolute: 0.1 10*3/uL (ref 0.0–0.1)
Basophils Relative: 0.8 % (ref 0.0–3.0)
Eosinophils Absolute: 0.1 10*3/uL (ref 0.0–0.7)
Eosinophils Relative: 1.1 % (ref 0.0–5.0)
HCT: 38.9 % (ref 36.0–46.0)
Hemoglobin: 12.9 g/dL (ref 12.0–15.0)
Lymphocytes Relative: 35.6 % (ref 12.0–46.0)
Lymphs Abs: 2.3 10*3/uL (ref 0.7–4.0)
MCHC: 33.1 g/dL (ref 30.0–36.0)
MCV: 92 fl (ref 78.0–100.0)
Monocytes Absolute: 0.5 10*3/uL (ref 0.1–1.0)
Monocytes Relative: 8.3 % (ref 3.0–12.0)
Neutro Abs: 3.5 10*3/uL (ref 1.4–7.7)
Neutrophils Relative %: 54.2 % (ref 43.0–77.0)
Platelets: 286 10*3/uL (ref 150.0–400.0)
RBC: 4.22 Mil/uL (ref 3.87–5.11)
RDW: 13.9 % (ref 11.5–15.5)
WBC: 6.5 10*3/uL (ref 4.0–10.5)

## 2021-11-06 LAB — LIPID PANEL
Cholesterol: 176 mg/dL (ref 0–200)
HDL: 78.1 mg/dL (ref >39.00)
LDL Cholesterol: 85 mg/dL (ref 0–99)
NonHDL: 97.94
Total CHOL/HDL Ratio: 2
Triglycerides: 64 mg/dL (ref 0.0–149.0)
VLDL: 12.8 mg/dL (ref 0.0–40.0)

## 2021-11-06 LAB — TSH: TSH: 1.61 u[IU]/mL (ref 0.35–5.50)

## 2021-11-06 NOTE — Patient Instructions (Signed)
Pepcid (famotidine) '20mg'$  - take one tablet 30 minutes before breakfast and 30 minutes before your evening meal   Mammogram 11/30/21 - 3:40pm

## 2021-11-06 NOTE — Assessment & Plan Note (Signed)
Physical today 11/06/21.  PAP 11/06/20 - negative with negative HPV. Mammogram 11/2020 - birads I.  Colonoscopy just performed.  Need results.  Per pt, recommended f/u in 10 years.

## 2021-11-06 NOTE — Telephone Encounter (Signed)
Lvm w/[facility] medical records for them to fax pt's Upper Endoscopy & Colonoscopy results to our ofc '@[phone]. Instructed them to call our ofc once results have been faxed.

## 2021-11-06 NOTE — Progress Notes (Unsigned)
[name], female   DOB: 28-Sep-1955, 65 y.o.   MRN: [id number]   Subjective:    [name], female    DOB: [birth year], 66 y.o.   MRN: [id number]   Patient here for  Chief Complaint  Patient presents with   Annual Exam    Yearly physical.  No complaints of discomfort or concerns.     Marland Kitchen   HPI With past history of GERD.  Here to follow up on this as well as for a complete physical exam.  Saw GI f/u 06/2021.  Pepcid increased to bid. Recommended EGD and colonoscopy.  Had procedures.  EGD - hiatal hernia.  Recommended f/u colonoscopy in 10 years.  Still with persistent symptoms - taking pepcid q day currently.  Had tried bid - taking second dose before bed.  No sob reported.  No abdominal pain.  Bowels moving.  Going to gym.    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   Osteoporosis    Vitamin D deficiency    Past Surgical History:  Procedure Laterality Date   BIOPSY  07/31/2020   Procedure: BIOPSY;  Surgeon: Irving Copas., MD;  Location: [place];  Service: Gastroenterology;;   BREAST EXCISIONAL BIOPSY Left 1989   benign   [place]   left breast biopsy   COLONOSCOPY     x 2   ESOPHAGOGASTRODUODENOSCOPY (EGD) WITH PROPOFOL N/A 05/30/2020   Procedure: ESOPHAGOGASTRODUODENOSCOPY (EGD) WITH PROPOFOL;  Surgeon: Virgel Manifold, MD;  Location: [facility];  Service: Endoscopy;  Laterality: N/A;   ESOPHAGOGASTRODUODENOSCOPY (EGD) WITH PROPOFOL N/A 07/31/2020   Procedure: ESOPHAGOGASTRODUODENOSCOPY (EGD) WITH PROPOFOL;  Surgeon: Rush Landmark Telford Nab., MD;  Location: [place];  Service: Gastroenterology;  Laterality: N/A;   EYE SURGERY Bilateral 1999   lasik   POLYPECTOMY  07/31/2020   Procedure: POLYPECTOMY;  Surgeon: Mansouraty, Telford Nab., MD;  Location: [facility];  Service: Gastroenterology;;   TUBAL LIGATION     WISDOM TOOTH  EXTRACTION     Family History  Problem Relation Age of Onset   Breast cancer Mother 67       died age 32   Parkinson's disease Father    Lymphoma Maternal Grandmother        non-hodgkin's   Ovarian cancer Neg Hx    Diabetes Neg Hx    Colon cancer Neg Hx    Heart disease Neg Hx    Social History   Socioeconomic History   Marital status: Divorced    Spouse name: Not on file   Number of children: 1   Years of education: Not on file   Highest education level: Not on file  Occupational History   Not on file  Tobacco Use   Smoking status: Never   Smokeless tobacco: Never  Vaping Use   Vaping Use: Never used  Substance and Sexual Activity   Alcohol use: Not Currently    Comment: occasional   Drug use: No   Sexual activity: Yes    Birth control/protection: Surgical, Post-menopausal    Comment: Tubal Ligation  Other Topics Concern   Not on file  Social History Narrative   Not on file   Social Determinants of Health   Financial Resource Strain: Low Risk  (09/08/2021)   Overall Financial Resource Strain (CARDIA)    Difficulty of Paying Living Expenses: Not hard at all  Food Insecurity:  No Food Insecurity (09/08/2021)   Hunger Vital Sign    Worried About Running Out of Food in the Last Year: Never true    Ran Out of Food in the Last Year: Never true  Transportation Needs: No Transportation Needs (09/08/2021)   PRAPARE - Hydrologist (Medical): No    Lack of Transportation (Non-Medical): No  Physical Activity: Insufficiently Active (09/08/2021)   Exercise Vital Sign    Days of Exercise per Week: 2 days    Minutes of Exercise per Session: 60 min  Stress: No Stress Concern Present (09/08/2021)   [place]    Feeling of Stress : Not at all  Social Connections: Unknown (09/08/2021)   Social Connection and Isolation Panel [NHANES]    Frequency of Communication with Friends and Family:  More than three times a week    Frequency of Social Gatherings with Friends and Family: More than three times a week    Attends Religious Services: Not on Advertising copywriter or Organizations: Not on file    Attends Archivist Meetings: Not on file    Marital Status: Not on file     Review of Systems  Constitutional:  Negative for appetite change and unexpected weight change.  HENT:  Negative for congestion, sinus pressure and sore throat.   Eyes:  Negative for pain and visual disturbance.  Respiratory:  Negative for cough, chest tightness and shortness of breath.   Cardiovascular:  Negative for chest pain, palpitations and leg swelling.  Gastrointestinal:  Negative for abdominal pain, diarrhea, nausea and vomiting.       Reflux as outlined.   Genitourinary:  Negative for difficulty urinating and dysuria.  Musculoskeletal:  Negative for joint swelling and myalgias.  Skin:  Negative for color change and rash.  Neurological:  Negative for dizziness, light-headedness and headaches.  Hematological:  Negative for adenopathy. Does not bruise/bleed easily.  Psychiatric/Behavioral:  Negative for agitation and dysphoric mood.        Objective:     BP 126/82 (BP Location: Left Arm, Patient Position: Sitting, Cuff Size: Normal)   Pulse 82   Temp 98.2 F (36.8 C) (Oral)   Ht '5\' 1"'$  (1.549 m)   Wt 125 lb 3.2 oz (56.8 kg)   LMP  (LMP Unknown)   SpO2 99%   BMI 23.66 kg/m  Wt Readings from Last 3 Encounters:  11/06/21 125 lb 3.2 oz (56.8 kg)  09/08/21 125 lb (56.7 kg)  05/15/21 125 lb 12.8 oz (57.1 kg)    Physical Exam Vitals reviewed.  Constitutional:      General: She is not in acute distress.    Appearance: Normal appearance. She is well-developed.  HENT:     Head: Normocephalic and atraumatic.     Right Ear: External ear normal.     Left Ear: External ear normal.  Eyes:     General: No scleral icterus.       Right eye: No discharge.        Left eye: No  discharge.     Conjunctiva/sclera: Conjunctivae normal.  Neck:     Thyroid: No thyromegaly.  Cardiovascular:     Rate and Rhythm: Normal rate and regular rhythm.  Pulmonary:     Effort: No tachypnea, accessory muscle usage or respiratory distress.     Breath sounds: Normal breath sounds. No decreased breath sounds, wheezing or rhonchi.  Chest:  Breasts:    Right: No inverted nipple, mass, nipple discharge or tenderness (no axillary adenopathy).     Left: No inverted nipple, mass, nipple discharge or tenderness (no axilarry adenopathy).  Abdominal:     General: Bowel sounds are normal.     Palpations: Abdomen is soft.     Tenderness: There is no abdominal tenderness.  Musculoskeletal:        General: No swelling or tenderness.     Cervical back: Neck supple.  Lymphadenopathy:     Cervical: No cervical adenopathy.  Skin:    General: Skin is warm.     Findings: No erythema or rash.  Neurological:     Mental Status: She is alert and oriented to person, place, and time.  Psychiatric:        Mood and Affect: Mood normal.        Behavior: Behavior normal.      Outpatient Encounter Medications as of 11/06/2021  Medication Sig   Ascorbic Acid (VITAMIN C) 1000 MG tablet Take 1,000 mg by mouth daily.   calcium carbonate (OS-CAL - DOSED IN MG OF ELEMENTAL CALCIUM) 1250 (500 Ca) MG tablet Take 1 tablet by mouth daily with breakfast.   famotidine (PEPCID) 20 MG tablet Take 1 tablet by mouth once daily   Multiple Vitamins-Minerals (MULTIVITAMIN PO) Take 1 tablet by mouth daily.   triamcinolone cream (KENALOG) 0.1 % Apply 1 application topically 2 (two) times daily. (Patient taking differently: Apply 1 application  topically 2 (two) times daily as needed (irritation).)   Vitamin D, Cholecalciferol, 25 MCG (1000 UT) CAPS Take 1,000 Units by mouth daily.   zinc gluconate 50 MG tablet Take 50 mg by mouth daily.   No facility-administered encounter medications on file as of 11/06/2021.     Lab  Results  Component Value Date   WBC 6.5 11/06/2021   HGB 12.9 11/06/2021   HCT 38.9 11/06/2021   PLT 286.0 11/06/2021   GLUCOSE 85 11/06/2021   CHOL 176 11/06/2021   TRIG 64.0 11/06/2021   HDL 78.10 11/06/2021   LDLCALC 85 11/06/2021   ALT 18 11/06/2021   AST 20 11/06/2021   NA 138 11/06/2021   K 4.1 11/06/2021   CL 100 11/06/2021   CREATININE 0.79 11/06/2021   BUN 15 11/06/2021   CO2 28 11/06/2021   TSH 1.61 11/06/2021    MM 3D SCREEN BREAST BILATERAL  Result Date: 12/03/2020 CLINICAL DATA:  Screening. EXAM: DIGITAL SCREENING BILATERAL MAMMOGRAM WITH TOMOSYNTHESIS AND CAD TECHNIQUE: Bilateral screening digital craniocaudal and mediolateral oblique mammograms were obtained. Bilateral screening digital breast tomosynthesis was performed. The images were evaluated with computer-aided detection. COMPARISON:  Previous exam(s). ACR Breast Density Category b: There are scattered areas of fibroglandular density. FINDINGS: There are no findings suspicious for malignancy. IMPRESSION: No mammographic evidence of malignancy. A result letter of this screening mammogram will be mailed directly to the patient. RECOMMENDATION: Screening mammogram in one year. (Code:[id number]) BI-RADS CATEGORY  1: Negative. Electronically Signed   By: Audie Pinto M.D.   On: 12/03/2020 14:57      Assessment & Plan:   Problem List Items Addressed This Visit     Gastroesophageal reflux disease    EGD 06/2020.  Recommended f/u EGD in one year.  EGD 07/2021 - pathology squamous mucosa with no significant histopathologic changes.  Recommended no f/u EGD.  Taking pepcid.  Instructed to start taking pepcid '20mg'$  30 minutes before breakfast and 30 minutes before evening meal. Follow.  Call with  udpate       Relevant Orders   CBC with Differential/Platelet (Completed)   Comprehensive metabolic panel (Completed)   Healthcare maintenance    Physical today 11/06/21.  PAP 11/06/20 - negative with negative HPV. Mammogram  11/2020 - birads I.  Colonoscopy just performed.  Need results.  Per pt, recommended f/u in 10 years.       Relevant Orders   MM 3D SCREEN BREAST BILATERAL   Hematuria    Previously saw Dr Erlene Quan.  Recommended f/u urinalysis.  F/u urine  - no red blood cells.       Stress    Overall appears to be handling things well.        Vitamin D deficiency    Continue vitamin D supplements.       Other Visit Diagnoses     Encounter for screening mammogram for malignant neoplasm of breast    -  Primary   Screening cholesterol level       Relevant Orders   Lipid panel (Completed)   TSH (Completed)   Breast cancer screening by mammogram       Relevant Orders   MM 3D SCREEN BREAST BILATERAL        Einar Pheasant, MD

## 2021-11-07 ENCOUNTER — Encounter: Payer: Self-pay | Admitting: Internal Medicine

## 2021-11-07 NOTE — Assessment & Plan Note (Signed)
Previously saw Dr Erlene Quan.  Recommended f/u urinalysis.  F/u urine  - no red blood cells.

## 2021-11-07 NOTE — Assessment & Plan Note (Signed)
Continue vitamin D supplements.  

## 2021-11-07 NOTE — Assessment & Plan Note (Signed)
Overall appears to be handling things well.   

## 2021-11-07 NOTE — Assessment & Plan Note (Signed)
EGD 06/2020.  Recommended f/u EGD in one year.  EGD 07/2021 - pathology squamous mucosa with no significant histopathologic changes.  Recommended no f/u EGD.  Taking pepcid.  Instructed to start taking pepcid '20mg'$  30 minutes before breakfast and 30 minutes before evening meal. Follow.  Call with udpate

## 2021-11-30 ENCOUNTER — Ambulatory Visit
Admission: RE | Admit: 2021-11-30 | Discharge: 2021-11-30 | Disposition: A | Discharge status: Home / Self Care | Payer: Medicare Other | Source: Ambulatory Visit | Attending: Internal Medicine | Admitting: Internal Medicine

## 2021-11-30 DIAGNOSIS — Z0000 Encounter for general adult medical examination without abnormal findings: Secondary | ICD-10-CM

## 2021-11-30 DIAGNOSIS — Z1231 Encounter for screening mammogram for malignant neoplasm of breast: Secondary | ICD-10-CM | POA: Insufficient documentation

## 2022-02-10 ENCOUNTER — Ambulatory Visit (INDEPENDENT_AMBULATORY_CARE_PROVIDER_SITE_OTHER): Payer: Medicare Other | Admitting: Internal Medicine

## 2022-02-10 ENCOUNTER — Encounter: Payer: Self-pay | Admitting: Internal Medicine

## 2022-02-10 VITALS — BP 120/76 | HR 93 | Temp 98.1°F | Resp 15 | Ht 61.0 in | Wt 127.6 lb

## 2022-02-10 DIAGNOSIS — E559 Vitamin D deficiency, unspecified: Secondary | ICD-10-CM

## 2022-02-10 DIAGNOSIS — F439 Reaction to severe stress, unspecified: Secondary | ICD-10-CM

## 2022-02-10 DIAGNOSIS — K219 Gastro-esophageal reflux disease without esophagitis: Secondary | ICD-10-CM

## 2022-02-10 DIAGNOSIS — Z8742 Personal history of other diseases of the female genital tract: Secondary | ICD-10-CM

## 2022-02-10 MED ORDER — FAMOTIDINE 20 MG PO TABS: 20.0000 mg | ORAL_TABLET | Freq: Two times a day (BID) | ORAL | 1 refills | Status: DC

## 2022-02-10 NOTE — Progress Notes (Signed)
[name], female   DOB: 02-08-1956, 66 y.o.   MRN: [id number]   Subjective:    [name], female    DOB: [birth year], 66 y.o.   MRN: [id number]   Patient here for  Chief Complaint  Patient presents with   Medical Management of Chronic Issues   .   HPI Here to follow up regarding acid reflux.  Saw GI f/u 06/2021.  Recommended EGD and colonoscopy.  Had procedures.  EGD - hiatal hernia.  Recommended f/u colonoscopy in 10 years. Was still having issues with increased acid reflux last visit.  Increased papcid to bid.  This is working well for her.  Feels back to near normal. No sob reported.  No abdominal pain.  Bowels moving.  Going to gym  exercising.  3 days per week.    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   Osteoporosis    Vitamin D deficiency    Past Surgical History:  Procedure Laterality Date   BIOPSY  07/31/2020   Procedure: BIOPSY;  Surgeon: Irving Copas., MD;  Location: [place];  Service: Gastroenterology;;   BREAST EXCISIONAL BIOPSY Left 1989   benign   [place]   left breast biopsy   COLONOSCOPY     x 2   ESOPHAGOGASTRODUODENOSCOPY (EGD) WITH PROPOFOL N/A 05/30/2020   Procedure: ESOPHAGOGASTRODUODENOSCOPY (EGD) WITH PROPOFOL;  Surgeon: Virgel Manifold, MD;  Location: [facility] ENDOSCOPY;  Service: Endoscopy;  Laterality: N/A;   ESOPHAGOGASTRODUODENOSCOPY (EGD) WITH PROPOFOL N/A 07/31/2020   Procedure: ESOPHAGOGASTRODUODENOSCOPY (EGD) WITH PROPOFOL;  Surgeon: Rush Landmark Telford Nab., MD;  Location: [place];  Service: Gastroenterology;  Laterality: N/A;   EYE SURGERY Bilateral 1999   lasik   POLYPECTOMY  07/31/2020   Procedure: POLYPECTOMY;  Surgeon: Mansouraty, Telford Nab., MD;  Location: [facility] ENDOSCOPY;  Service: Gastroenterology;;   TUBAL LIGATION     WISDOM TOOTH EXTRACTION     Family History  Problem Relation Age  of Onset   Breast cancer Mother 72       died age 67   Parkinson's disease Father    Lymphoma Maternal Grandmother        non-hodgkin's   Ovarian cancer Neg Hx    Diabetes Neg Hx    Colon cancer Neg Hx    Heart disease Neg Hx    Social History   Socioeconomic History   Marital status: Divorced    Spouse name: Not on file   Number of children: 1   Years of education: Not on file   Highest education level: Not on file  Occupational History   Not on file  Tobacco Use   Smoking status: Never   Smokeless tobacco: Never  Vaping Use   Vaping Use: Never used  Substance and Sexual Activity   Alcohol use: Not Currently    Comment: occasional   Drug use: No   Sexual activity: Yes    Birth control/protection: Surgical, Post-menopausal    Comment: Tubal Ligation  Other Topics Concern   Not on file  Social History Narrative   Not on file   Social Determinants of Health   Financial Resource Strain: Low Risk  (09/08/2021)   Overall Financial Resource Strain (CARDIA)    Difficulty of Paying Living Expenses: Not hard at all  Food Insecurity: No Food Insecurity (09/08/2021)   Hunger Vital Sign    Worried About Running  Out of Food in the Last Year: Never true    Ran Out of Food in the Last Year: Never true  Transportation Needs: No Transportation Needs (09/08/2021)   PRAPARE - Hydrologist (Medical): No    Lack of Transportation (Non-Medical): No  Physical Activity: Insufficiently Active (09/08/2021)   Exercise Vital Sign    Days of Exercise per Week: 2 days    Minutes of Exercise per Session: 60 min  Stress: No Stress Concern Present (09/08/2021)   [place]    Feeling of Stress : Not at all  Social Connections: Unknown (09/08/2021)   Social Connection and Isolation Panel [NHANES]    Frequency of Communication with Friends and Family: More than three times a week    Frequency of Social  Gatherings with Friends and Family: More than three times a week    Attends Religious Services: Not on Advertising copywriter or Organizations: Not on file    Attends Archivist Meetings: Not on file    Marital Status: Not on file     Review of Systems  Constitutional:  Negative for appetite change and unexpected weight change.  HENT:  Negative for congestion, sinus pressure and sore throat.   Eyes:  Negative for pain and visual disturbance.  Respiratory:  Negative for cough, chest tightness and shortness of breath.   Cardiovascular:  Negative for chest pain, palpitations and leg swelling.  Gastrointestinal:  Negative for abdominal pain, diarrhea, nausea and vomiting.       Reflux as outlined.   Genitourinary:  Negative for difficulty urinating and dysuria.  Musculoskeletal:  Negative for joint swelling and myalgias.  Skin:  Negative for color change and rash.  Neurological:  Negative for dizziness, light-headedness and headaches.  Hematological:  Negative for adenopathy. Does not bruise/bleed easily.  Psychiatric/Behavioral:  Negative for agitation and dysphoric mood.        Objective:     BP 120/76 (BP Location: Left Arm, Patient Position: Sitting, Cuff Size: Small)   Pulse 93   Temp 98.1 F (36.7 C) (Temporal)   Resp 15   Ht '5\' 1"'$  (1.549 m)   Wt 127 lb 9.6 oz (57.9 kg)   LMP  (LMP Unknown)   SpO2 95%   BMI 24.11 kg/m  Wt Readings from Last 3 Encounters:  02/10/22 127 lb 9.6 oz (57.9 kg)  11/06/21 125 lb 3.2 oz (56.8 kg)  09/08/21 125 lb (56.7 kg)    Physical Exam Vitals reviewed.  Constitutional:      General: She is not in acute distress.    Appearance: Normal appearance. She is well-developed.  HENT:     Head: Normocephalic and atraumatic.     Right Ear: External ear normal.     Left Ear: External ear normal.  Eyes:     General: No scleral icterus.       Right eye: No discharge.        Left eye: No discharge.     Conjunctiva/sclera:  Conjunctivae normal.  Neck:     Thyroid: No thyromegaly.  Cardiovascular:     Rate and Rhythm: Normal rate and regular rhythm.  Pulmonary:     Effort: No tachypnea, accessory muscle usage or respiratory distress.     Breath sounds: Normal breath sounds. No decreased breath sounds or wheezing.  Chest:  Breasts:    Right: No inverted nipple, mass, nipple discharge or  tenderness (no axillary adenopathy).     Left: No inverted nipple, mass, nipple discharge or tenderness (no axilarry adenopathy).  Abdominal:     General: Bowel sounds are normal.     Palpations: Abdomen is soft.     Tenderness: There is no abdominal tenderness.  Musculoskeletal:        General: No swelling or tenderness.     Cervical back: Neck supple.  Lymphadenopathy:     Cervical: No cervical adenopathy.  Skin:    Findings: No erythema or rash.  Neurological:     Mental Status: She is alert and oriented to person, place, and time.  Psychiatric:        Mood and Affect: Mood normal.        Behavior: Behavior normal.      Outpatient Encounter Medications as of 02/10/2022  Medication Sig   Ascorbic Acid (VITAMIN C) 1000 MG tablet Take 1,000 mg by mouth daily.   calcium carbonate (OS-CAL - DOSED IN MG OF ELEMENTAL CALCIUM) 1250 (500 Ca) MG tablet Take 1 tablet by mouth daily with breakfast.   Multiple Vitamins-Minerals (MULTIVITAMIN PO) Take 1 tablet by mouth daily.   triamcinolone cream (KENALOG) 0.1 % Apply 1 application topically 2 (two) times daily. (Patient taking differently: Apply 1 application  topically 2 (two) times daily as needed (irritation).)   Vitamin D, Cholecalciferol, 25 MCG (1000 UT) CAPS Take 1,000 Units by mouth daily.   zinc gluconate 50 MG tablet Take 50 mg by mouth daily.   [DISCONTINUED] famotidine (PEPCID) 20 MG tablet Take 1 tablet by mouth once daily (Patient taking differently: Take 20 mg by mouth 2 (two) times daily.)   famotidine (PEPCID) 20 MG tablet Take 1 tablet (20 mg total) by  mouth 2 (two) times daily.   No facility-administered encounter medications on file as of 02/10/2022.     Lab Results  Component Value Date   WBC 6.5 11/06/2021   HGB 12.9 11/06/2021   HCT 38.9 11/06/2021   PLT 286.0 11/06/2021   GLUCOSE 85 11/06/2021   CHOL 176 11/06/2021   TRIG 64.0 11/06/2021   HDL 78.10 11/06/2021   LDLCALC 85 11/06/2021   ALT 18 11/06/2021   AST 20 11/06/2021   NA 138 11/06/2021   K 4.1 11/06/2021   CL 100 11/06/2021   CREATININE 0.79 11/06/2021   BUN 15 11/06/2021   CO2 28 11/06/2021   TSH 1.61 11/06/2021    MM 3D SCREEN BREAST BILATERAL  Result Date: 12/03/2020 CLINICAL DATA:  Screening. EXAM: DIGITAL SCREENING BILATERAL MAMMOGRAM WITH TOMOSYNTHESIS AND CAD TECHNIQUE: Bilateral screening digital craniocaudal and mediolateral oblique mammograms were obtained. Bilateral screening digital breast tomosynthesis was performed. The images were evaluated with computer-aided detection. COMPARISON:  Previous exam(s). ACR Breast Density Category b: There are scattered areas of fibroglandular density. FINDINGS: There are no findings suspicious for malignancy. IMPRESSION: No mammographic evidence of malignancy. A result letter of this screening mammogram will be mailed directly to the patient. RECOMMENDATION: Screening mammogram in one year. (Code:SM-B-01Y) BI-RADS CATEGORY  1: Negative. Electronically Signed   By: Audie Pinto M.D.   On: 12/03/2020 14:57      Assessment & Plan:   Problem List Items Addressed This Visit     Gastroesophageal reflux disease - Primary    EGD 06/2020.  Recommended f/u EGD in one year.  EGD 07/2021 - pathology squamous mucosa with no significant histopathologic changes.  Recommended no f/u EGD.  Taking pepcid '20mg'$  30 minutes before breakfast and 30  minutes before evening meal. Follow.  Acid reflux improved.  Follow.        Relevant Medications   famotidine (PEPCID) 20 MG tablet   History of abnormal cervical Pap smear    PAP  11/06/20 - negative with negative HPV.       Stress    Overall appears to be handling things well.        Vitamin D deficiency    Continue vitamin D supplements.  Check vitamin D level.        Einar Pheasant, MD

## 2022-02-24 ENCOUNTER — Encounter: Payer: Self-pay | Admitting: Internal Medicine

## 2022-02-24 NOTE — Assessment & Plan Note (Signed)
EGD 06/2020.  Recommended f/u EGD in one year.  EGD 07/2021 - pathology squamous mucosa with no significant histopathologic changes.  Recommended no f/u EGD.  Taking pepcid '20mg'$  30 minutes before breakfast and 30 minutes before evening meal. Follow.  Acid reflux improved.  Follow.

## 2022-02-24 NOTE — Assessment & Plan Note (Signed)
PAP 11/06/20 - negative with negative HPV.

## 2022-02-24 NOTE — Assessment & Plan Note (Signed)
Overall appears to be handling things well.

## 2022-02-24 NOTE — Assessment & Plan Note (Addendum)
Continue vitamin D supplements.  Check vitamin D level.

## 2022-03-24 ENCOUNTER — Telehealth: Payer: Self-pay | Admitting: Internal Medicine

## 2022-03-24 ENCOUNTER — Encounter: Payer: Self-pay | Admitting: Internal Medicine

## 2022-03-24 ENCOUNTER — Ambulatory Visit (INDEPENDENT_AMBULATORY_CARE_PROVIDER_SITE_OTHER): Payer: Medicare Other | Admitting: Internal Medicine

## 2022-03-24 VITALS — BP 130/84 | HR 93 | Temp 97.8°F | Resp 17 | Ht 61.0 in | Wt 129.4 lb

## 2022-03-24 DIAGNOSIS — M25551 Pain in right hip: Secondary | ICD-10-CM

## 2022-03-24 DIAGNOSIS — K219 Gastro-esophageal reflux disease without esophagitis: Secondary | ICD-10-CM | POA: Diagnosis not present

## 2022-03-24 MED ORDER — METHYLPREDNISOLONE 4 MG PO TBPK: ORAL_TABLET | ORAL | 0 refills | Status: DC

## 2022-03-24 NOTE — Telephone Encounter (Signed)
Patient called and wanted to know what she could do for her Sciatica nerve pain, going on for 3 weeks.Does she need a referral to go and have a injection?

## 2022-03-24 NOTE — Telephone Encounter (Signed)
See if she can come in this pm - work in at 3:30.

## 2022-03-24 NOTE — Telephone Encounter (Signed)
Patient complaining of right sided hip/leg pain for 3 weeks. Pain worse when sitting and starts in the hip area- radiates down to her foot. Denies pain anywhere else. Patient was sitting with feet propped up and dog laying on her legs putting pressure on them "quite a bit" and has aggravated her sciatica. She states she has had this in the past and knows what is going on. Has tried Tylenol, stretches, and heat/cold therapy but nothing has helped. Patient wondering if there is anything else she can take or do at home or should she be referred to a specialist?

## 2022-03-24 NOTE — Progress Notes (Signed)
Subjective:    [name], female    DOB: Nov 29, 1955, 67 y.o.   MRN: [id number]  Patient here for  Chief Complaint  Patient presents with   Sciatica    C/O Right sciatica pain x 2-3 weeks. Worse when sitting down. Pain Scale: 5    HPI Work in appt. Work in for right "sciatic nerve pain".  States she has had sciatica previously and this feel similar.  Reports symptoms started 2-3 weeks ago.  Worse with sitting.  States she has been doing a lot of cross stitch and sitting with legs extended.  Her dog has been sitting on her legs.  Feels this may have aggravated.  No injury or trauma.  No fall.  Has been sleeping with pillow between her knees.  Has tried tylenol, ibuprofen, ice, heating pad and stretches.  Is some better today.     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   Osteoporosis    Vitamin D deficiency    Past Surgical History:  Procedure Laterality Date   BIOPSY  07/31/2020   Procedure: BIOPSY;  Surgeon: Irving Copas., MD;  Location: [place];  Service: Gastroenterology;;   BREAST EXCISIONAL BIOPSY Left 1989   benign   [place]   left breast biopsy   COLONOSCOPY     x 2   ESOPHAGOGASTRODUODENOSCOPY (EGD) WITH PROPOFOL N/A 05/30/2020   Procedure: ESOPHAGOGASTRODUODENOSCOPY (EGD) WITH PROPOFOL;  Surgeon: Virgel Manifold, MD;  Location: [facility] ENDOSCOPY;  Service: Endoscopy;  Laterality: N/A;   ESOPHAGOGASTRODUODENOSCOPY (EGD) WITH PROPOFOL N/A 07/31/2020   Procedure: ESOPHAGOGASTRODUODENOSCOPY (EGD) WITH PROPOFOL;  Surgeon: Rush Landmark Telford Nab., MD;  Location: [place];  Service: Gastroenterology;  Laterality: N/A;   EYE SURGERY Bilateral 1999   lasik   POLYPECTOMY  07/31/2020   Procedure: POLYPECTOMY;  Surgeon: Mansouraty, Telford Nab., MD;  Location: [facility] ENDOSCOPY;  Service: Gastroenterology;;   TUBAL LIGATION     WISDOM TOOTH EXTRACTION      Family History  Problem Relation Age of Onset   Breast cancer Mother 56       died age 27   Parkinson's disease Father    Lymphoma Maternal Grandmother        non-hodgkin's   Ovarian cancer Neg Hx    Diabetes Neg Hx    Colon cancer Neg Hx    Heart disease Neg Hx    Social History   Socioeconomic History   Marital status: Divorced    Spouse name: Not on file   Number of children: 1   Years of education: Not on file   Highest education level: Not on file  Occupational History   Not on file  Tobacco Use   Smoking status: Never   Smokeless tobacco: Never  Vaping Use   Vaping Use: Never used  Substance and Sexual Activity   Alcohol use: Not Currently    Comment: occasional   Drug use: No   Sexual activity: Yes    Birth control/protection: Surgical, Post-menopausal    Comment: Tubal Ligation  Other Topics Concern   Not on file  Social History Narrative   Not on file   Social Determinants of Health   Financial Resource Strain: Low Risk  (09/08/2021)   Overall Financial Resource Strain (CARDIA)    Difficulty of Paying Living Expenses: Not hard at all  Food Insecurity: No Food Insecurity (09/08/2021)   Hunger  Vital Sign    Worried About Charity fundraiser in the Last Year: Never true    Ran Out of Food in the Last Year: Never true  Transportation Needs: No Transportation Needs (09/08/2021)   PRAPARE - Hydrologist (Medical): No    Lack of Transportation (Non-Medical): No  Physical Activity: Insufficiently Active (09/08/2021)   Exercise Vital Sign    Days of Exercise per Week: 2 days    Minutes of Exercise per Session: 60 min  Stress: No Stress Concern Present (09/08/2021)   [place]    Feeling of Stress : Not at all  Social Connections: Unknown (09/08/2021)   Social Connection and Isolation Panel [NHANES]    Frequency of Communication with Friends and Family: More than  three times a week    Frequency of Social Gatherings with Friends and Family: More than three times a week    Attends Religious Services: Not on Advertising copywriter or Organizations: Not on file    Attends Archivist Meetings: Not on file    Marital Status: Not on file     Review of Systems  Constitutional:  Negative for appetite change and unexpected weight change.  HENT:  Negative for sinus pressure.   Respiratory:  Negative for cough, chest tightness and shortness of breath.   Cardiovascular:  Negative for chest pain, palpitations and leg swelling.  Gastrointestinal:  Negative for abdominal pain, nausea and vomiting.  Genitourinary:  Negative for difficulty urinating and dysuria.  Musculoskeletal:  Negative for joint swelling and myalgias.       Right buttock and leg pain as outlined.   Skin:  Negative for color change and rash.  Neurological:  Negative for dizziness and headaches.  Psychiatric/Behavioral:  Negative for agitation and dysphoric mood.        Objective:     BP 130/84   Pulse 93   Temp 97.8 F (36.6 C) (Oral)   Resp 17   Ht '5\' 1"'$  (1.549 m)   Wt 129 lb 6 oz (58.7 kg)   LMP  (LMP Unknown)   SpO2 98%   BMI 24.45 kg/m  Wt Readings from Last 3 Encounters:  03/24/22 129 lb 6 oz (58.7 kg)  02/10/22 127 lb 9.6 oz (57.9 kg)  11/06/21 125 lb 3.2 oz (56.8 kg)    Physical Exam Vitals reviewed.  Constitutional:      General: She is not in acute distress.    Appearance: Normal appearance.  HENT:     Head: Normocephalic and atraumatic.     Right Ear: External ear normal.     Left Ear: External ear normal.  Eyes:     General: No scleral icterus.       Right eye: No discharge.        Left eye: No discharge.     Conjunctiva/sclera: Conjunctivae normal.  Neck:     Thyroid: No thyromegaly.  Cardiovascular:     Rate and Rhythm: Normal rate and regular rhythm.  Pulmonary:     Effort: No respiratory distress.     Breath sounds: Normal  breath sounds. No wheezing.  Abdominal:     General: Bowel sounds are normal.     Palpations: Abdomen is soft.     Tenderness: There is no abdominal tenderness.  Musculoskeletal:        General: No swelling or tenderness.     Cervical  back: Neck supple. No tenderness.     Comments: Negative SLR.  Some increased tenderness - right buttock.  Motor strength equal and normal bilateral lower extremities.   Lymphadenopathy:     Cervical: No cervical adenopathy.  Skin:    Findings: No erythema or rash.  Neurological:     Mental Status: She is alert.  Psychiatric:        Mood and Affect: Mood normal.        Behavior: Behavior normal.      Outpatient Encounter Medications as of 03/24/2022  Medication Sig   Ascorbic Acid (VITAMIN C) 1000 MG tablet Take 1,000 mg by mouth daily.   calcium carbonate (OS-CAL - DOSED IN MG OF ELEMENTAL CALCIUM) 1250 (500 Ca) MG tablet Take 1 tablet by mouth daily with breakfast.   famotidine (PEPCID) 20 MG tablet Take 1 tablet (20 mg total) by mouth 2 (two) times daily.   methylPREDNISolone (MEDROL DOSEPAK) 4 MG TBPK tablet Medrol dose pak - 6 day taper.  Take as directed.   Multiple Vitamins-Minerals (MULTIVITAMIN PO) Take 1 tablet by mouth daily.   triamcinolone cream (KENALOG) 0.1 % Apply 1 application topically 2 (two) times daily. (Patient taking differently: Apply 1 application  topically 2 (two) times daily as needed (irritation).)   Vitamin D, Cholecalciferol, 25 MCG (1000 UT) CAPS Take 1,000 Units by mouth daily.   zinc gluconate 50 MG tablet Take 50 mg by mouth daily.   No facility-administered encounter medications on file as of 03/24/2022.     Lab Results  Component Value Date   WBC 6.5 11/06/2021   HGB 12.9 11/06/2021   HCT 38.9 11/06/2021   PLT 286.0 11/06/2021   GLUCOSE 85 11/06/2021   CHOL 176 11/06/2021   TRIG 64.0 11/06/2021   HDL 78.10 11/06/2021   LDLCALC 85 11/06/2021   ALT 18 11/06/2021   AST 20 11/06/2021   NA 138 11/06/2021    K 4.1 11/06/2021   CL 100 11/06/2021   CREATININE 0.79 11/06/2021   BUN 15 11/06/2021   CO2 28 11/06/2021   TSH 1.61 11/06/2021    MM 3D SCREEN BREAST BILATERAL  Result Date: 12/01/2021 CLINICAL DATA:  Screening. EXAM: DIGITAL SCREENING BILATERAL MAMMOGRAM WITH TOMOSYNTHESIS AND CAD TECHNIQUE: Bilateral screening digital craniocaudal and mediolateral oblique mammograms were obtained. Bilateral screening digital breast tomosynthesis was performed. The images were evaluated with computer-aided detection. COMPARISON:  Previous exam(s). ACR Breast Density Category b: There are scattered areas of fibroglandular density. FINDINGS: There are no findings suspicious for malignancy. IMPRESSION: No mammographic evidence of malignancy. A result letter of this screening mammogram will be mailed directly to the patient. RECOMMENDATION: Screening mammogram in one year. (Code:SM-B-01Y) BI-RADS CATEGORY  1: Negative. Electronically Signed   By: Audie Pinto M.D.   On: 12/01/2021 15:40       Assessment & Plan:  Right hip pain Assessment & Plan: Right hip/buttock and leg pain as outlined.  States feels similar to her previous flare of sciatica.  Exam as outlined.  Discussed continuing stretches.  Medrol dose pak as directed.  Follow.  Call with update.     Gastroesophageal reflux disease, unspecified whether esophagitis present Assessment & Plan: Discussed steroids and possible GI irritation.  Continue pepcid as directed.  Follow.    Other orders -     methylPREDNISolone; Medrol dose pak - 6 day taper.  Take as directed.  Dispense: 21 tablet; Refill: 0     Einar Pheasant, MD

## 2022-03-24 NOTE — Telephone Encounter (Signed)
Pt scheduled  

## 2022-03-28 ENCOUNTER — Encounter: Payer: Self-pay | Admitting: Internal Medicine

## 2022-03-28 NOTE — Assessment & Plan Note (Signed)
Right hip/buttock and leg pain as outlined.  States feels similar to her previous flare of sciatica.  Exam as outlined.  Discussed continuing stretches.  Medrol dose pak as directed.  Follow.  Call with update.

## 2022-03-28 NOTE — Assessment & Plan Note (Signed)
Discussed steroids and possible GI irritation.  Continue pepcid as directed.  Follow.

## 2022-07-09 ENCOUNTER — Telehealth: Payer: Self-pay | Admitting: Internal Medicine

## 2022-07-09 NOTE — Telephone Encounter (Signed)
Right side pain above the hip and when moving pain goes down into the calf and foot. Pain is constant now and Patient states she has seen chiropractic a few times with no relief.Please advise

## 2022-07-09 NOTE — Telephone Encounter (Signed)
Patient is scheduled for 05/13/22 at 10:00 but she is agreeable to if the pain gets worse to be seen by UC or [facility] walk in.

## 2022-07-09 NOTE — Telephone Encounter (Signed)
If persistent pain, needs to be reevaluated.  I do not mind working her in next week, but if needs something prior will need to be seen - acute visit.

## 2022-07-09 NOTE — Telephone Encounter (Signed)
Pt called stating her leg is not any better and she is suffering from sciatica

## 2022-07-13 ENCOUNTER — Ambulatory Visit (INDEPENDENT_AMBULATORY_CARE_PROVIDER_SITE_OTHER): Payer: Medicare Other | Admitting: Internal Medicine

## 2022-07-13 ENCOUNTER — Encounter: Payer: Self-pay | Admitting: Internal Medicine

## 2022-07-13 VITALS — BP 128/76 | HR 100 | Temp 98.0°F | Resp 16 | Ht 61.0 in | Wt 131.0 lb

## 2022-07-13 DIAGNOSIS — M79604 Pain in right leg: Secondary | ICD-10-CM

## 2022-07-13 DIAGNOSIS — M25551 Pain in right hip: Secondary | ICD-10-CM | POA: Diagnosis not present

## 2022-07-13 DIAGNOSIS — K219 Gastro-esophageal reflux disease without esophagitis: Secondary | ICD-10-CM

## 2022-07-13 MED ORDER — GABAPENTIN 100 MG PO CAPS: ORAL_CAPSULE | ORAL | 1 refills | Status: DC

## 2022-07-13 NOTE — Progress Notes (Signed)
Subjective:    [name], female    DOB: [birth year], 67 y.o.   MRN: [id number]  Patient here for  Chief Complaint  Patient presents with   Hip Pain   Leg Pain    HPI Here for work in appt - right hip pain/right leg pain.  Has a history of sciatica.  Right hip - tight.  Into right calf - tight. Symptoms present for several months. Was evaluated in 03/2022.  Prescribed medrol dosepak.  Did no help.  Has been taking tylenol.  Felt this helped some previously.  Not helping now.  Has tried ibuprofen.  Describes - burning pain - starts in buttocks and goes down to her foot.  Lying down may help.     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   Osteoporosis    Vitamin D deficiency    Past Surgical History:  Procedure Laterality Date   BIOPSY  07/31/2020   Procedure: BIOPSY;  Surgeon: Lemar Lofty., MD;  Location: [facility] ENDOSCOPY;  Service: Gastroenterology;;   BREAST EXCISIONAL BIOPSY Left 1989   benign   BREAST SURGERY  1989   left breast biopsy   COLONOSCOPY     x 2   ESOPHAGOGASTRODUODENOSCOPY (EGD) WITH PROPOFOL N/A 05/30/2020   Procedure: ESOPHAGOGASTRODUODENOSCOPY (EGD) WITH PROPOFOL;  Surgeon: Pasty Spillers, MD;  Location: [facility] ENDOSCOPY;  Service: Endoscopy;  Laterality: N/A;   ESOPHAGOGASTRODUODENOSCOPY (EGD) WITH PROPOFOL N/A 07/31/2020   Procedure: ESOPHAGOGASTRODUODENOSCOPY (EGD) WITH PROPOFOL;  Surgeon: Meridee Score Netty Starring., MD;  Location: [facility] ENDOSCOPY;  Service: Gastroenterology;  Laterality: N/A;   EYE SURGERY Bilateral 1999   lasik   POLYPECTOMY  07/31/2020   Procedure: POLYPECTOMY;  Surgeon: Mansouraty, Netty Starring., MD;  Location: [facility] ENDOSCOPY;  Service: Gastroenterology;;   TUBAL LIGATION     WISDOM TOOTH EXTRACTION     Family History  Problem Relation Age of Onset   Breast cancer Mother 82       died age 66   Parkinson's disease Father     Lymphoma Maternal Grandmother        non-hodgkin's   Ovarian cancer Neg Hx    Diabetes Neg Hx    Colon cancer Neg Hx    Heart disease Neg Hx    Social History   Socioeconomic History   Marital status: Divorced    Spouse name: Not on file   Number of children: 1   Years of education: Not on file   Highest education level: Not on file  Occupational History   Not on file  Tobacco Use   Smoking status: Never   Smokeless tobacco: Never  Vaping Use   Vaping Use: Never used  Substance and Sexual Activity   Alcohol use: Not Currently    Comment: occasional   Drug use: No   Sexual activity: Yes    Birth control/protection: Surgical, Post-menopausal    Comment: Tubal Ligation  Other Topics Concern   Not on file  Social History Narrative   Not on file   Social Determinants of Health   Financial Resource Strain: Low Risk  (09/08/2021)   Overall Financial Resource Strain (CARDIA)    Difficulty of Paying Living Expenses: Not hard at all  Food Insecurity: No Food Insecurity (09/08/2021)   Hunger Vital Sign    Worried About Running Out of Food in the Last Year: Never true    Ran Out of  Food in the Last Year: Never true  Transportation Needs: No Transportation Needs (09/08/2021)   PRAPARE - Administrator, Civil Service (Medical): No    Lack of Transportation (Non-Medical): No  Physical Activity: Insufficiently Active (09/08/2021)   Exercise Vital Sign    Days of Exercise per Week: 2 days    Minutes of Exercise per Session: 60 min  Stress: No Stress Concern Present (09/08/2021)   [employer] of Occupational Health - Occupational Stress Questionnaire    Feeling of Stress : Not at all  Social Connections: Unknown (09/08/2021)   Social Connection and Isolation Panel [NHANES]    Frequency of Communication with Friends and Family: More than three times a week    Frequency of Social Gatherings with Friends and Family: More than three times a week    Attends Religious  Services: Not on Marketing executive or Organizations: Not on file    Attends Banker Meetings: Not on file    Marital Status: Not on file     Review of Systems  Constitutional:  Negative for appetite change and unexpected weight change.  HENT:  Negative for congestion and sinus pressure.   Respiratory:  Negative for cough, chest tightness and shortness of breath.   Cardiovascular:  Negative for chest pain and palpitations.  Gastrointestinal:  Negative for abdominal pain, diarrhea, nausea and vomiting.  Genitourinary:  Negative for difficulty urinating and dysuria.  Musculoskeletal:  Negative for myalgias.       Pain - buttock extending down to her foot as outlined.   Skin:  Negative for color change and rash.  Neurological:  Negative for dizziness and headaches.  Psychiatric/Behavioral:  Negative for agitation and dysphoric mood.        Objective:     BP 128/76   Pulse 100   Temp 98 F (36.7 C)   Resp 16   Ht 5\' 1"  (1.549 m)   Wt 131 lb (59.4 kg)   LMP  (LMP Unknown)   SpO2 98%   BMI 24.75 kg/m  Wt Readings from Last 3 Encounters:  07/13/22 131 lb (59.4 kg)  03/24/22 129 lb 6 oz (58.7 kg)  02/10/22 127 lb 9.6 oz (57.9 kg)    Physical Exam Vitals reviewed.  Constitutional:      General: She is not in acute distress.    Appearance: Normal appearance.  HENT:     Head: Normocephalic and atraumatic.     Right Ear: External ear normal.     Left Ear: External ear normal.  Eyes:     General: No scleral icterus.       Right eye: No discharge.        Left eye: No discharge.     Conjunctiva/sclera: Conjunctivae normal.  Neck:     Thyroid: No thyromegaly.  Cardiovascular:     Rate and Rhythm: Normal rate and regular rhythm.  Pulmonary:     Effort: No respiratory distress.     Breath sounds: Normal breath sounds. No wheezing.  Abdominal:     General: Bowel sounds are normal.     Palpations: Abdomen is soft.     Tenderness: There is no  abdominal tenderness.  Musculoskeletal:        General: No swelling.     Cervical back: Neck supple. No tenderness.     Comments: Minimal tenderness - palpation - trochanteric region.  Some pulling with SLR.  No weakness.   Lymphadenopathy:  Cervical: No cervical adenopathy.  Skin:    Findings: No erythema or rash.  Neurological:     Mental Status: She is alert.  Psychiatric:        Mood and Affect: Mood normal.        Behavior: Behavior normal.      Outpatient Encounter Medications as of 07/13/2022  Medication Sig   gabapentin (NEURONTIN) 100 MG capsule 1-2 tablets q hs   Ascorbic Acid (VITAMIN C) 1000 MG tablet Take 1,000 mg by mouth daily.   calcium carbonate (OS-CAL - DOSED IN MG OF ELEMENTAL CALCIUM) 1250 (500 Ca) MG tablet Take 1 tablet by mouth daily with breakfast.   famotidine (PEPCID) 20 MG tablet Take 1 tablet (20 mg total) by mouth 2 (two) times daily.   Multiple Vitamins-Minerals (MULTIVITAMIN PO) Take 1 tablet by mouth daily.   triamcinolone cream (KENALOG) 0.1 % Apply 1 application topically 2 (two) times daily. (Patient taking differently: Apply 1 application  topically 2 (two) times daily as needed (irritation).)   Vitamin D, Cholecalciferol, 25 MCG (1000 UT) CAPS Take 1,000 Units by mouth daily.   zinc gluconate 50 MG tablet Take 50 mg by mouth daily.   [DISCONTINUED] methylPREDNISolone (MEDROL DOSEPAK) 4 MG TBPK tablet Medrol dose pak - 6 day taper.  Take as directed.   No facility-administered encounter medications on file as of 07/13/2022.     Lab Results  Component Value Date   WBC 6.5 11/06/2021   HGB 12.9 11/06/2021   HCT 38.9 11/06/2021   PLT 286.0 11/06/2021   GLUCOSE 85 11/06/2021   CHOL 176 11/06/2021   TRIG 64.0 11/06/2021   HDL 78.10 11/06/2021   LDLCALC 85 11/06/2021   ALT 18 11/06/2021   AST 20 11/06/2021   NA 138 11/06/2021   K 4.1 11/06/2021   CL 100 11/06/2021   CREATININE 0.79 11/06/2021   BUN 15 11/06/2021   CO2 28 11/06/2021    TSH 1.61 11/06/2021    MM 3D SCREEN BREAST BILATERAL  Result Date: 12/01/2021 CLINICAL DATA:  Screening. EXAM: DIGITAL SCREENING BILATERAL MAMMOGRAM WITH TOMOSYNTHESIS AND CAD TECHNIQUE: Bilateral screening digital craniocaudal and mediolateral oblique mammograms were obtained. Bilateral screening digital breast tomosynthesis was performed. The images were evaluated with computer-aided detection. COMPARISON:  Previous exam(s). ACR Breast Density Category b: There are scattered areas of fibroglandular density. FINDINGS: There are no findings suspicious for malignancy. IMPRESSION: No mammographic evidence of malignancy. A result letter of this screening mammogram will be mailed directly to the patient. RECOMMENDATION: Screening mammogram in one year. (Code:SM-B-01Y) BI-RADS CATEGORY  1: Negative. Electronically Signed   By: Emmaline Kluver M.D.   On: 12/01/2021 15:40       Assessment & Plan:  Right hip pain -     Ambulatory referral to Orthopedic Surgery  Pain of right lower extremity Assessment & Plan: Right hip and leg pain as outlined. (Radicular symptoms as outlined).  No pain in groin with abduction/adduction of lower extremity.  Medrol dosepak did not help.  Discussed stretches.  Has been taking tylenol and ibuprofen.  Persistent pain.  Trial of gabapentin as directed.  Refer to ortho.  Hold xrays until ortho evaluation.     Gastroesophageal reflux disease, unspecified whether esophagitis present Assessment & Plan: Continue pepcid.  Follow.    Other orders -     Gabapentin; 1-2 tablets q hs  Dispense: 60 capsule; Refill: 1     Dale Holt, MD

## 2022-07-23 ENCOUNTER — Other Ambulatory Visit: Payer: Self-pay | Admitting: Orthopedic Surgery

## 2022-07-23 DIAGNOSIS — M5410 Radiculopathy, site unspecified: Secondary | ICD-10-CM

## 2022-07-26 ENCOUNTER — Encounter: Payer: Self-pay | Admitting: Internal Medicine

## 2022-07-26 DIAGNOSIS — M79606 Pain in leg, unspecified: Secondary | ICD-10-CM | POA: Insufficient documentation

## 2022-07-26 NOTE — Assessment & Plan Note (Signed)
Right hip and leg pain as outlined. (Radicular symptoms as outlined).  No pain in groin with abduction/adduction of lower extremity.  Medrol dosepak did not help.  Discussed stretches.  Has been taking tylenol and ibuprofen.  Persistent pain.  Trial of gabapentin as directed.  Refer to ortho.  Hold xrays until ortho evaluation.

## 2022-07-26 NOTE — Assessment & Plan Note (Signed)
Continue pepcid.  Follow.  

## 2022-08-07 ENCOUNTER — Ambulatory Visit
Admission: RE | Admit: 2022-08-07 | Discharge: 2022-08-07 | Disposition: A | Discharge status: Home / Self Care | Payer: Medicare Other | Source: Ambulatory Visit | Attending: Orthopedic Surgery | Admitting: Orthopedic Surgery

## 2022-08-07 DIAGNOSIS — M5410 Radiculopathy, site unspecified: Secondary | ICD-10-CM

## 2022-08-10 ENCOUNTER — Other Ambulatory Visit: Payer: Medicare Other

## 2022-08-12 ENCOUNTER — Ambulatory Visit (INDEPENDENT_AMBULATORY_CARE_PROVIDER_SITE_OTHER): Payer: Medicare Other | Admitting: Internal Medicine

## 2022-08-12 ENCOUNTER — Encounter: Payer: Self-pay | Admitting: Internal Medicine

## 2022-08-12 VITALS — BP 120/72 | HR 89 | Temp 98.0°F | Resp 16 | Ht 61.0 in | Wt 133.0 lb

## 2022-08-12 DIAGNOSIS — M25551 Pain in right hip: Secondary | ICD-10-CM

## 2022-08-12 DIAGNOSIS — M810 Age-related osteoporosis without current pathological fracture: Secondary | ICD-10-CM

## 2022-08-12 DIAGNOSIS — R5383 Other fatigue: Secondary | ICD-10-CM

## 2022-08-12 DIAGNOSIS — Z13220 Encounter for screening for lipoid disorders: Secondary | ICD-10-CM

## 2022-08-12 DIAGNOSIS — F439 Reaction to severe stress, unspecified: Secondary | ICD-10-CM

## 2022-08-12 DIAGNOSIS — K219 Gastro-esophageal reflux disease without esophagitis: Secondary | ICD-10-CM

## 2022-08-12 MED ORDER — FAMOTIDINE 20 MG PO TABS: 20.0000 mg | ORAL_TABLET | Freq: Two times a day (BID) | ORAL | 3 refills | Status: DC

## 2022-08-12 MED ORDER — GABAPENTIN 100 MG PO CAPS: ORAL_CAPSULE | ORAL | 1 refills | Status: DC

## 2022-08-12 NOTE — Progress Notes (Signed)
Subjective:    [name], female    DOB: 1955-12-17, 67 y.o.   MRN: [id number]  Patient here for  Chief Complaint  Patient presents with   Medical Management of Chronic Issues    HPI Scheduled follow up - f/u GERD.  Saw GI f/u 06/2021. Recommended EGD and colonoscopy. Had procedures. EGD - hiatal hernia. Recommended f/u colonoscopy in 10 years. On pepcid.  Also recently evaluated for right hip/leg pain.  Saw ortho.  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  Persistent increased pain. Limiting activity.  Taking scheduled tylenol and ibuprofen.  Taking pepcid.  Controlling acid reflux symptoms. No abdominal pain or cramping.  Discussed trial of gabapentin.    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   Osteoporosis    Vitamin D deficiency    Past Surgical History:  Procedure Laterality Date   BIOPSY  07/31/2020   Procedure: BIOPSY;  Surgeon: Lemar Lofty., MD;  Location: [facility] ENDOSCOPY;  Service: Gastroenterology;;   BREAST EXCISIONAL BIOPSY Left 1989   benign   BREAST SURGERY  1989   left breast biopsy   COLONOSCOPY     x 2   ESOPHAGOGASTRODUODENOSCOPY (EGD) WITH PROPOFOL N/A 05/30/2020   Procedure: ESOPHAGOGASTRODUODENOSCOPY (EGD) WITH PROPOFOL;  Surgeon: Pasty Spillers, MD;  Location: [facility] ENDOSCOPY;  Service: Endoscopy;  Laterality: N/A;   ESOPHAGOGASTRODUODENOSCOPY (EGD) WITH PROPOFOL N/A 07/31/2020   Procedure: ESOPHAGOGASTRODUODENOSCOPY (EGD) WITH PROPOFOL;  Surgeon: Meridee Score Netty Starring., MD;  Location: [facility] ENDOSCOPY;  Service: Gastroenterology;  Laterality: N/A;   EYE SURGERY Bilateral 1999   lasik   POLYPECTOMY  07/31/2020   Procedure: POLYPECTOMY;  Surgeon: Mansouraty, Netty Starring., MD;  Location: [facility] ENDOSCOPY;  Service: Gastroenterology;;   TUBAL LIGATION     WISDOM  TOOTH EXTRACTION     Family History  Problem Relation Age of Onset   Breast cancer Mother 81       died age 54   Parkinson's disease Father    Lymphoma Maternal Grandmother        non-hodgkin's   Ovarian cancer Neg Hx    Diabetes Neg Hx    Colon cancer Neg Hx    Heart disease Neg Hx    Social History   Socioeconomic History   Marital status: Divorced    Spouse name: Not on file   Number of children: 1   Years of education: Not on file   Highest education level: Some college, no degree  Occupational History   Not on file  Tobacco Use   Smoking status: Never   Smokeless tobacco: Never  Vaping Use   Vaping Use: Never used  Substance and Sexual Activity   Alcohol use: Not Currently    Comment: occasional   Drug use: No   Sexual activity: Yes    Birth control/protection: Surgical, Post-menopausal    Comment: Tubal Ligation  Other Topics Concern   Not on file  Social History Narrative   Not on file   Social Determinants of Health   Financial Resource Strain: Low Risk  (08/11/2022)   Overall Financial Resource Strain (CARDIA)    Difficulty of Paying Living Expenses: Not hard at all  Food Insecurity: No Food Insecurity (08/11/2022)   Hunger Vital Sign    Worried About Running Out of Food in the Last Year: Never true  Ran Out of Food in the Last Year: Never true  Transportation Needs: No Transportation Needs (08/11/2022)   PRAPARE - Administrator, Civil Service (Medical): No    Lack of Transportation (Non-Medical): No  Physical Activity: Inactive (08/11/2022)   Exercise Vital Sign    Days of Exercise per Week: 0 days    Minutes of Exercise per Session: 60 min  Stress: No Stress Concern Present (08/11/2022)   [employer] of Occupational Health - Occupational Stress Questionnaire    Feeling of Stress : Not at all  Social Connections: Socially Isolated (08/11/2022)   Social Connection and Isolation Panel [NHANES]    Frequency of Communication with  Friends and Family: More than three times a week    Frequency of Social Gatherings with Friends and Family: More than three times a week    Attends Religious Services: Never    Database administrator or Organizations: No    Attends Engineer, structural: Not on file    Marital Status: Divorced     Review of Systems  Constitutional:  Negative for appetite change and unexpected weight change.  HENT:  Negative for congestion and sinus pressure.   Respiratory:  Negative for cough, chest tightness and shortness of breath.   Cardiovascular:  Negative for chest pain, palpitations and leg swelling.  Gastrointestinal:  Negative for abdominal pain, diarrhea, nausea and vomiting.  Genitourinary:  Negative for difficulty urinating and dysuria.  Musculoskeletal:  Negative for myalgias.       Right hip and leg pain as outlined.   Skin:  Negative for color change and rash.  Neurological:  Negative for dizziness and headaches.  Psychiatric/Behavioral:  Negative for agitation and dysphoric mood.        Objective:     BP 120/72   Pulse 89   Temp 98 F (36.7 C)   Resp 16   Ht 5\' 1"  (1.549 m)   Wt 133 lb (60.3 kg)   LMP  (LMP Unknown)   SpO2 98%   BMI 25.13 kg/m  Wt Readings from Last 3 Encounters:  08/12/22 133 lb (60.3 kg)  07/13/22 131 lb (59.4 kg)  03/24/22 129 lb 6 oz (58.7 kg)    Physical Exam Vitals reviewed.  Constitutional:      General: She is not in acute distress.    Appearance: Normal appearance.  HENT:     Head: Normocephalic and atraumatic.     Right Ear: External ear normal.     Left Ear: External ear normal.  Eyes:     General: No scleral icterus.       Right eye: No discharge.        Left eye: No discharge.     Conjunctiva/sclera: Conjunctivae normal.  Neck:     Thyroid: No thyromegaly.  Cardiovascular:     Rate and Rhythm: Normal rate and regular rhythm.  Pulmonary:     Effort: No respiratory distress.     Breath sounds: Normal breath sounds. No  wheezing.  Abdominal:     General: Bowel sounds are normal.     Palpations: Abdomen is soft.     Tenderness: There is no abdominal tenderness.  Musculoskeletal:        General: No swelling or tenderness.     Cervical back: Neck supple. No tenderness.  Lymphadenopathy:     Cervical: No cervical adenopathy.  Skin:    Findings: No erythema or rash.  Neurological:     Mental Status:  She is alert.  Psychiatric:        Mood and Affect: Mood normal.        Behavior: Behavior normal.      Outpatient Encounter Medications as of 08/12/2022  Medication Sig   Ascorbic Acid (VITAMIN C) 1000 MG tablet Take 1,000 mg by mouth daily.   calcium carbonate (OS-CAL - DOSED IN MG OF ELEMENTAL CALCIUM) 1250 (500 Ca) MG tablet Take 1 tablet by mouth daily with breakfast.   famotidine (PEPCID) 20 MG tablet Take 1 tablet (20 mg total) by mouth 2 (two) times daily.   gabapentin (NEURONTIN) 100 MG capsule 2 tablets q hs   Multiple Vitamins-Minerals (MULTIVITAMIN PO) Take 1 tablet by mouth daily.   triamcinolone cream (KENALOG) 0.1 % Apply 1 application topically 2 (two) times daily. (Patient taking differently: Apply 1 application  topically 2 (two) times daily as needed (irritation).)   Vitamin D, Cholecalciferol, 25 MCG (1000 UT) CAPS Take 1,000 Units by mouth daily.   zinc gluconate 50 MG tablet Take 50 mg by mouth daily.   [DISCONTINUED] famotidine (PEPCID) 20 MG tablet Take 1 tablet (20 mg total) by mouth 2 (two) times daily.   [DISCONTINUED] gabapentin (NEURONTIN) 100 MG capsule 1-2 tablets q hs   No facility-administered encounter medications on file as of 08/12/2022.     Lab Results  Component Value Date   WBC 6.5 11/06/2021   HGB 12.9 11/06/2021   HCT 38.9 11/06/2021   PLT 286.0 11/06/2021   GLUCOSE 85 11/06/2021   CHOL 176 11/06/2021   TRIG 64.0 11/06/2021   HDL 78.10 11/06/2021   LDLCALC 85 11/06/2021   ALT 18 11/06/2021   AST 20 11/06/2021   NA 138 11/06/2021   K 4.1 11/06/2021   CL  100 11/06/2021   CREATININE 0.79 11/06/2021   BUN 15 11/06/2021   CO2 28 11/06/2021   TSH 1.61 11/06/2021    MR LUMBAR SPINE WO CONTRAST  Result Date: 08/07/2022 CLINICAL DATA:  Six months of right-sided low back pain radiating to the legs EXAM: MRI LUMBAR SPINE WITHOUT CONTRAST TECHNIQUE: Multiplanar, multisequence MR imaging of the lumbar spine was performed. No intravenous contrast was administered. COMPARISON:  None Available. FINDINGS: Segmentation:  Standard. Alignment:  Mild degenerative anterolisthesis at L4-5 Vertebrae: Marrow edema at the left L4-5 facet. No fracture or aggressive bone lesion Conus medullaris and cauda equina: Conus extends to the T12-L1 level. Conus and cauda equina appear normal. Paraspinal and other soft tissues: Negative for perispinal mass or inflammation Disc levels: T12- L1: Unremarkable. L1-L2: Unremarkable. L2-L3: Foraminal predominant disc bulging.  No neural compression L3-L4: Foraminal predominant disc bulging.  Mild facet spurring L4-L5: Bulky degenerative facet spurring. Anterolisthesis and disc bulging. Mild triangular narrowing the thecal sac. L5-S1:Greatest level of degenerative disc narrowing. Mild endplate and facet spurring. 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 Electronically Signed   By: Tiburcio Pea M.D.   On: 08/07/2022 09:43       Assessment & Plan:  Screening cholesterol level -     Lipid panel; Future  Gastroesophageal reflux disease, unspecified whether esophagitis present -     CBC with Differential/Platelet; Future -     Comprehensive metabolic panel; Future  Osteoporosis without current pathological fracture, unspecified osteoporosis type Assessment & Plan: Calcium, vitamin D.  Weight bearing exercise as tolerated.  Consider f/u bone density.    Other fatigue -  TSH; Future  Right hip pain Assessment  & Plan: Saw ortho.  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  Persistent increased pain. Limiting activity.  Taking scheduled tylenol and ibuprofen. Discussed trial of gabapentin.  Keep f/u with ortho.  Call with update.    Stress Assessment & Plan: Overall appears to be handling things well.     Other orders -     Famotidine; Take 1 tablet (20 mg total) by mouth 2 (two) times daily.  Dispense: 180 tablet; Refill: 3 -     Gabapentin; 2 tablets q hs  Dispense: 60 capsule; Refill: 1     Dale [place], MD

## 2022-08-18 ENCOUNTER — Encounter: Payer: Self-pay | Admitting: Internal Medicine

## 2022-08-18 NOTE — Assessment & Plan Note (Signed)
Overall appears to be handling things well.   

## 2022-08-18 NOTE — Assessment & Plan Note (Signed)
Saw ortho.  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  Persistent increased pain. Limiting activity.  Taking scheduled tylenol and ibuprofen. Discussed trial of gabapentin.  Keep f/u with ortho.  Call with update.

## 2022-08-18 NOTE — Assessment & Plan Note (Signed)
Calcium, vitamin D.  Weight bearing exercise as tolerated.  Consider f/u bone density.

## 2022-09-07 ENCOUNTER — Telehealth: Payer: Self-pay | Admitting: Internal Medicine

## 2022-09-07 NOTE — Telephone Encounter (Signed)
Copied from CRM [id number]. Topic: Medicare AWV >> Sep 07, 2022  9:42 AM Payton Doughty wrote: Reason for CRM: LVM AWV time change from 1:30pm to 1:45pm. Please confirm appt time change khc.  Verlee Rossetti; Care Guide Ambulatory Clinical Support [place] [facility] Direct Dial: [phone]

## 2022-09-13 ENCOUNTER — Ambulatory Visit: Payer: Medicare Other | Admitting: *Deleted

## 2022-09-13 VITALS — Ht 61.0 in | Wt 132.0 lb

## 2022-09-13 DIAGNOSIS — Z0000 Encounter for general adult medical examination without abnormal findings: Secondary | ICD-10-CM

## 2022-09-13 NOTE — Patient Instructions (Signed)
Autumn Wolfe , Thank you for taking time to come for your Medicare Wellness Visit. I appreciate your ongoing commitment to your health goals. Please review the following plan we discussed and let me know if I can assist you in the future.   These are the goals we discussed:  Goals      Follow up with Primary Care Provider     As needed.     Patient Stated     Get rid of pain that she is having now, and start back exercising        This is a list of the screening recommended for you and due dates:  Health Maintenance  Topic Date Due   Pneumonia Vaccine (1 of 1 - PCV) Never done   Flu Shot  09/30/2022   Medicare Annual Wellness Visit  09/13/2023   Mammogram  12/01/2023   Colon Cancer Screening  07/18/2031   DEXA scan (bone density measurement)  Completed   HPV Vaccine  Aged Out   DTaP/Tdap/Td vaccine  Discontinued   COVID-19 Vaccine  Discontinued   Hepatitis C Screening  Discontinued   Zoster (Shingles) Vaccine  Discontinued    Advanced directives: Will bring  Conditions/risks identified: None  Next appointment: Follow up in one year for your annual wellness visit 09/20/23 @ 9:30   Preventive Care 65 Years and Older, Female Preventive care refers to lifestyle choices and visits with your health care provider that can promote health and wellness. What does preventive care include? A yearly physical exam. This is also called an annual well check. Dental exams once or twice a year. Routine eye exams. Ask your health care provider how often you should have your eyes checked. Personal lifestyle choices, including: Daily care of your teeth and gums. Regular physical activity. Eating a healthy diet. Avoiding tobacco and drug use. Limiting alcohol use. Practicing safe sex. Taking low-dose aspirin every day. Taking vitamin and mineral supplements as recommended by your health care provider. What happens during an annual well check? The services and screenings done by your  health care provider during your annual well check will depend on your age, overall health, lifestyle risk factors, and family history of disease. Counseling  Your health care provider may ask you questions about your: Alcohol use. Tobacco use. Drug use. Emotional well-being. Home and relationship well-being. Sexual activity. Eating habits. History of falls. Memory and ability to understand (cognition). Work and work Astronomer. Reproductive health. Screening  You may have the following tests or measurements: Height, weight, and BMI. Blood pressure. Lipid and cholesterol levels. These may be checked every 5 years, or more frequently if you are over 13 years old. Skin check. Lung cancer screening. You may have this screening every year starting at age 42 if you have a 30-pack-year history of smoking and currently smoke or have quit within the past 15 years. Fecal occult blood test (FOBT) of the stool. You may have this test every year starting at age 43. Flexible sigmoidoscopy or colonoscopy. You may have a sigmoidoscopy every 5 years or a colonoscopy every 10 years starting at age 50. Hepatitis C blood test. Hepatitis B blood test. Sexually transmitted disease (STD) testing. Diabetes screening. This is done by checking your blood sugar (glucose) after you have not eaten for a while (fasting). You may have this done every 1-3 years. Bone density scan. This is done to screen for osteoporosis. You may have this done starting at age 53. Mammogram. This may be done every  1-2 years. Talk to your health care provider about how often you should have regular mammograms. Talk with your health care provider about your test results, treatment options, and if necessary, the need for more tests. Vaccines  Your health care provider may recommend certain vaccines, such as: Influenza vaccine. This is recommended every year. Tetanus, diphtheria, and acellular pertussis (Tdap, Td) vaccine. You may  need a Td booster every 10 years. Zoster vaccine. You may need this after age 22. Pneumococcal 13-valent conjugate (PCV13) vaccine. One dose is recommended after age 28. Pneumococcal polysaccharide (PPSV23) vaccine. One dose is recommended after age 30. Talk to your health care provider about which screenings and vaccines you need and how often you need them. This information is not intended to replace advice given to you by your health care provider. Make sure you discuss any questions you have with your health care provider. Document Released: 03/14/2015 Document Revised: 11/05/2015 Document Reviewed: 12/17/2014 Elsevier Interactive Patient Education  2017 [employer].  Fall Prevention in the Home Falls can cause injuries. They can happen to people of all ages. There are many things you can do to make your home safe and to help prevent falls. What can I do on the outside of my home? Regularly fix the edges of walkways and driveways and fix any cracks. Remove anything that might make you trip as you walk through a door, such as a raised step or threshold. Trim any bushes or trees on the path to your home. Use bright outdoor lighting. Clear any walking paths of anything that might make someone trip, such as rocks or tools. Regularly check to see if handrails are loose or broken. Make sure that both sides of any steps have handrails. Any raised decks and porches should have guardrails on the edges. Have any leaves, snow, or ice cleared regularly. Use sand or salt on walking paths during winter. Clean up any spills in your garage right away. This includes oil or grease spills. What can I do in the bathroom? Use night lights. Install grab bars by the toilet and in the tub and shower. Do not use towel bars as grab bars. Use non-skid mats or decals in the tub or shower. If you need to sit down in the shower, use a plastic, non-slip stool. Keep the floor dry. Clean up any water that spills on  the floor as soon as it happens. Remove soap buildup in the tub or shower regularly. Attach bath mats securely with double-sided non-slip rug tape. Do not have throw rugs and other things on the floor that can make you trip. What can I do in the bedroom? Use night lights. Make sure that you have a light by your bed that is easy to reach. Do not use any sheets or blankets that are too big for your bed. They should not hang down onto the floor. Have a firm chair that has side arms. You can use this for support while you get dressed. Do not have throw rugs and other things on the floor that can make you trip. What can I do in the kitchen? Clean up any spills right away. Avoid walking on wet floors. Keep items that you use a lot in easy-to-reach places. If you need to reach something above you, use a strong step stool that has a grab bar. Keep electrical cords out of the way. Do not use floor polish or wax that makes floors slippery. If you must use wax, use non-skid  floor wax. Do not have throw rugs and other things on the floor that can make you trip. What can I do with my stairs? Do not leave any items on the stairs. Make sure that there are handrails on both sides of the stairs and use them. Fix handrails that are broken or loose. Make sure that handrails are as long as the stairways. Check any carpeting to make sure that it is firmly attached to the stairs. Fix any carpet that is loose or worn. Avoid having throw rugs at the top or bottom of the stairs. If you do have throw rugs, attach them to the floor with carpet tape. Make sure that you have a light switch at the top of the stairs and the bottom of the stairs. If you do not have them, ask someone to add them for you. What else can I do to help prevent falls? Wear shoes that: Do not have high heels. Have rubber bottoms. Are comfortable and fit you well. Are closed at the toe. Do not wear sandals. If you use a stepladder: Make sure  that it is fully opened. Do not climb a closed stepladder. Make sure that both sides of the stepladder are locked into place. Ask someone to hold it for you, if possible. Clearly mark and make sure that you can see: Any grab bars or handrails. First and last steps. Where the edge of each step is. Use tools that help you move around (mobility aids) if they are needed. These include: Canes. Walkers. Scooters. Crutches. Turn on the lights when you go into a dark area. Replace any light bulbs as soon as they burn out. Set up your furniture so you have a clear path. Avoid moving your furniture around. If any of your floors are uneven, fix them. If there are any pets around you, be aware of where they are. Review your medicines with your doctor. Some medicines can make you feel dizzy. This can increase your chance of falling. Ask your doctor what other things that you can do to help prevent falls. This information is not intended to replace advice given to you by your health care provider. Make sure you discuss any questions you have with your health care provider. Document Released: 12/12/2008 Document Revised: 07/24/2015 Document Reviewed: 03/22/2014 Elsevier Interactive Patient Education  2017 [employer].  Per patient no change in vitals since last visit, unable to obtain new vitals due to telehealth visit

## 2022-09-13 NOTE — Progress Notes (Signed)
Subjective:   Autumn Wolfe is a 67 y.o. female who presents for Medicare Annual (Subsequent) preventive examination.  Visit Complete: Virtual  I connected with  Autumn Wolfe on 09/13/22 by a audio enabled telemedicine application and verified that I am speaking with the correct person using two identifiers.  Patient Location: Home  Provider Location: Office/Clinic  I discussed the limitations of evaluation and management by telemedicine. The patient expressed understanding and agreed to proceed.  Review of Systems     Cardiac Risk Factors include: advanced age (>71men, >45 women)     Objective:    Today's Vitals   09/13/22 1334 09/13/22 1335  Weight: 132 lb (59.9 kg)   Height: 5\' 1"  (1.549 m)   PainSc:  4    Body mass index is 24.94 kg/m.     09/13/2022    1:51 PM 09/08/2021    2:23 PM 08/20/2020    3:28 PM 07/31/2020   10:42 AM 05/30/2020    9:12 AM  Advanced Directives  Does Patient Have a Medical Advance Directive? Yes Yes Yes Yes Yes  Type of Estate agent of [place];Living will Healthcare Power of [place];Living will Healthcare Power of [place];Living will Healthcare Power of [place];Living will Living will  Does patient want to make changes to medical advance directive?  No - Patient declined No - Patient declined    Copy of Healthcare Power of Attorney in Chart? No - copy requested No - copy requested No - copy requested No - copy requested     Current Medications (verified) Outpatient Encounter Medications as of 09/13/2022  Medication Sig   Ascorbic Acid (VITAMIN C) 1000 MG tablet Take 1,000 mg by mouth daily.   calcium carbonate (OS-CAL - DOSED IN MG OF ELEMENTAL CALCIUM) 1250 (500 Ca) MG tablet Take 1 tablet by mouth daily with breakfast.   famotidine (PEPCID) 20 MG tablet Take 1 tablet (20 mg total) by mouth 2 (two) times daily.   Multiple Vitamins-Minerals (MULTIVITAMIN PO) Take 1 tablet by mouth daily.   Vitamin D,  Cholecalciferol, 25 MCG (1000 UT) CAPS Take 1,000 Units by mouth daily.   zinc gluconate 50 MG tablet Take 50 mg by mouth daily.   gabapentin (NEURONTIN) 100 MG capsule 2 tablets q hs (Patient not taking: Reported on 09/13/2022)   triamcinolone cream (KENALOG) 0.1 % Apply 1 application topically 2 (two) times daily. (Patient not taking: Reported on 09/13/2022)   No facility-administered encounter medications on file as of 09/13/2022.    Allergies (verified) Patient has no known allergies.   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   Osteoporosis    Vitamin D deficiency    Past Surgical History:  Procedure Laterality Date   BIOPSY  07/31/2020   Procedure: BIOPSY;  Surgeon: Lemar Lofty., MD;  Location: [facility] ENDOSCOPY;  Service: Gastroenterology;;   BREAST EXCISIONAL BIOPSY Left 1989   benign   BREAST SURGERY  1989   left breast biopsy   COLONOSCOPY     x 2   ESOPHAGOGASTRODUODENOSCOPY (EGD) WITH PROPOFOL N/A 05/30/2020   Procedure: ESOPHAGOGASTRODUODENOSCOPY (EGD) WITH PROPOFOL;  Surgeon: Pasty Spillers, MD;  Location: [facility] ENDOSCOPY;  Service: Endoscopy;  Laterality: N/A;   ESOPHAGOGASTRODUODENOSCOPY (EGD) WITH PROPOFOL N/A 07/31/2020   Procedure: ESOPHAGOGASTRODUODENOSCOPY (EGD) WITH PROPOFOL;  Surgeon: Meridee Score Netty Starring., MD;  Location: [facility] ENDOSCOPY;  Service: Gastroenterology;  Laterality: N/A;   EYE SURGERY  Bilateral 1999   lasik   POLYPECTOMY  07/31/2020   Procedure: POLYPECTOMY;  Surgeon: Mansouraty, Netty Starring., MD;  Location: [facility] ENDOSCOPY;  Service: Gastroenterology;;   TUBAL LIGATION     WISDOM TOOTH EXTRACTION     Family History  Problem Relation Age of Onset   Breast cancer Mother 82       died age 62   Parkinson's disease Father    Lymphoma Maternal Grandmother        non-hodgkin's   Ovarian cancer Neg Hx    Diabetes Neg Hx    Colon cancer Neg Hx     Heart disease Neg Hx    Social History   Socioeconomic History   Marital status: Divorced    Spouse name: Not on file   Number of children: 1   Years of education: Not on file   Highest education level: Some college, no degree  Occupational History   Not on file  Tobacco Use   Smoking status: Never   Smokeless tobacco: Never  Vaping Use   Vaping status: Never Used  Substance and Sexual Activity   Alcohol use: Not Currently    Comment: occasional   Drug use: No   Sexual activity: Yes    Birth control/protection: Surgical, Post-menopausal    Comment: Tubal Ligation  Other Topics Concern   Not on file  Social History Narrative   Not on file   Social Determinants of Health   Financial Resource Strain: Low Risk  (09/13/2022)   Overall Financial Resource Strain (CARDIA)    Difficulty of Paying Living Expenses: Not hard at all  Food Insecurity: No Food Insecurity (09/13/2022)   Hunger Vital Sign    Worried About Running Out of Food in the Last Year: Never true    Ran Out of Food in the Last Year: Never true  Transportation Needs: No Transportation Needs (09/13/2022)   PRAPARE - Administrator, Civil Service (Medical): No    Lack of Transportation (Non-Medical): No  Physical Activity: Inactive (09/13/2022)   Exercise Vital Sign    Days of Exercise per Week: 0 days    Minutes of Exercise per Session: 0 min  Stress: No Stress Concern Present (09/13/2022)   [employer] of Occupational Health - Occupational Stress Questionnaire    Feeling of Stress : Not at all  Social Connections: Moderately Integrated (09/13/2022)   Social Connection and Isolation Panel [NHANES]    Frequency of Communication with Friends and Family: More than three times a week    Frequency of Social Gatherings with Friends and Family: More than three times a week    Attends Religious Services: More than 4 times per year    Active Member of [employer] or Organizations: Yes    Attends Museum/gallery exhibitions officer: More than 4 times per year    Marital Status: Divorced  Recent Concern: Social Connections - Moderately Isolated (09/13/2022)   Social Connection and Isolation Panel [NHANES]    Frequency of Communication with Friends and Family: More than three times a week    Frequency of Social Gatherings with Friends and Family: More than three times a week    Attends Religious Services: Never    Database administrator or Organizations: Yes    Attends Engineer, structural: More than 4 times per year    Marital Status: Divorced    Tobacco Counseling Counseling given: Not Answered   Clinical Intake:  Pre-visit preparation completed: Yes  Pain :  0-10 Pain Score: 4  Pain Type: Chronic pain Pain Location: Calf Pain Orientation: Right Pain Descriptors / Indicators: Burning, Constant Pain Onset: More than a month ago Pain Frequency: Constant Pain Relieving Factors: Ibuprofen and Tylenol  Pain Relieving Factors: Ibuprofen and Tylenol  BMI - recorded: 24.94 Nutritional Status: BMI of 19-24  Normal Nutritional Risks: None, Nausea/ vomitting/ diarrhea Diabetes: No  How often do you need to have someone help you when you read instructions, pamphlets, or other written materials from your doctor or pharmacy?: 1 - Never  Interpreter Needed?: No  Information entered by :: R. Basil Buffin LPN   Activities of Daily Living    09/13/2022    1:39 PM  In your present state of health, do you have any difficulty performing the following activities:  Hearing? 0  Vision? 0  Comment readers  Difficulty concentrating or making decisions? 0  Walking or climbing stairs? 1  Comment at times because of sciatia  Dressing or bathing? 0  Doing errands, shopping? 0  Preparing Food and eating ? N  Using the Toilet? N  In the past six months, have you accidently leaked urine? Y  Comment at times at night when sneezing  Do you have problems with loss of bowel control? N  Managing your  Medications? N  Managing your Finances? N  Housekeeping or managing your Housekeeping? N    Patient Care Team: Dale Wamsutter, MD as PCP - General (Internal Medicine)  Indicate any recent Medical Services you may have received from other than [facility] providers in the past year (date may be approximate).     Assessment:   This is a routine wellness examination for Jalan.  Hearing/Vision screen Hearing Screening - Comments:: No issues Vision Screening - Comments:: readers  Dietary issues and exercise activities discussed:     Goals Addressed             This Visit's Progress    Patient Stated       Get rid of pain that she is having now, and start back exercising       Depression Screen    09/13/2022    1:43 PM 03/24/2022    3:49 PM 02/10/2022    9:35 AM 11/06/2021   10:23 AM 09/08/2021    2:22 PM 08/20/2020    2:57 PM 01/16/2020    8:51 AM  PHQ 2/9 Scores  PHQ - 2 Score 0 0 0 0 0 0 1  PHQ- 9 Score 3 0     1    Fall Risk    09/13/2022    1:48 PM 03/24/2022    3:49 PM 02/10/2022    9:35 AM 11/06/2021   10:23 AM 09/08/2021    1:32 PM  Fall Risk   Falls in the past year? 0 0 0 0 0  Number falls in past yr: 0 0 0 0   Injury with Fall? 0 0 0 0   Risk for fall due to : No Fall Risks No Fall Risks No Fall Risks No Fall Risks   Follow up Falls prevention discussed;Falls evaluation completed  Falls evaluation completed Falls evaluation completed Falls evaluation completed    MEDICARE RISK AT HOME:  Medicare Risk at Home - 09/13/22 1348     Any stairs in or around the home? Yes    If so, are there any without handrails? Yes    Home free of loose throw rugs in walkways, pet beds, electrical cords, etc? Yes  Adequate lighting in your home to reduce risk of falls? Yes    Life alert? No    Use of a cane, walker or w/c? No    Grab bars in the bathroom? No    Shower chair or bench in shower? No    Elevated toilet seat or a handicapped toilet? No               Cognitive Function:        09/13/2022    1:51 PM  6CIT Screen  What Year? 0 points  What month? 0 points  What time? 0 points  Count back from 20 0 points  Months in reverse 0 points  Repeat phrase 0 points  Total Score 0 points    Immunizations  There is no immunization history on file for this patient.  TDAP status: Due, Education has been provided regarding the importance of this vaccine. Advised may receive this vaccine at local pharmacy or Health Dept. Aware to provide a copy of the vaccination record if obtained from local pharmacy or Health Dept. Verbalized acceptance and understanding.  Flu Vaccine status: Declined, Education has been provided regarding the importance of this vaccine but patient still declined. Advised may receive this vaccine at local pharmacy or Health Dept. Aware to provide a copy of the vaccination record if obtained from local pharmacy or Health Dept. Verbalized acceptance and understanding.  Pneumococcal vaccine status: Declined,  Education has been provided regarding the importance of this vaccine but patient still declined. Advised may receive this vaccine at local pharmacy or Health Dept. Aware to provide a copy of the vaccination record if obtained from local pharmacy or Health Dept. Verbalized acceptance and understanding.   Covid-19 vaccine status: Declined, Education has been provided regarding the importance of this vaccine but patient still declined. Advised may receive this vaccine at local pharmacy or Health Dept.or vaccine clinic. Aware to provide a copy of the vaccination record if obtained from local pharmacy or Health Dept. Verbalized acceptance and understanding.  Qualifies for Shingles Vaccine? Yes   Zostavax completed No   Shingrix Completed?: No.    Education has been provided regarding the importance of this vaccine. Patient has been advised to call insurance company to determine out of pocket expense if they have not yet  received this vaccine. Advised may also receive vaccine at local pharmacy or Health Dept. Verbalized acceptance and understanding.  Screening Tests Health Maintenance  Topic Date Due   Pneumonia Vaccine 91+ Years old (1 of 1 - PCV) Never done   Medicare Annual Wellness (AWV)  09/09/2022   INFLUENZA VACCINE  09/30/2022   MAMMOGRAM  12/01/2023   Colonoscopy  07/18/2031   DEXA SCAN  Completed   HPV VACCINES  Aged Out   DTaP/Tdap/Td  Discontinued   COVID-19 Vaccine  Discontinued   Hepatitis C Screening  Discontinued   Zoster Vaccines- Shingrix  Discontinued    Health Maintenance  Health Maintenance Due  Topic Date Due   Pneumonia Vaccine 62+ Years old (1 of 1 - PCV) Never done   Medicare Annual Wellness (AWV)  09/09/2022    Colorectal cancer screening: Type of screening: Colonoscopy. Completed 5/23. Repeat every 10 years  Mammogram status: Completed 10/23. Repeat every year  Bone Density status: Completed 7/21. Results reflect: Bone density results: OSTEOPOROSIS. Repeat every 2 years.  Patient declines   Lung Cancer Screening: (Low Dose CT Chest recommended if Age 54-80 years, 20 pack-year currently smoking OR have quit w/in 15years.) does  not qualify.     Additional Screening:  Hepatitis C Screening: does qualify; Completed patient declined  Vision Screening: Recommended annual ophthalmology exams for early detection of glaucoma and other disorders of the eye. Is the patient up to date with their annual eye exam?  Yes  Who is the provider or what is the name of the office in which the patient attends annual eye exams? [facility] If pt is not established with a provider, would they like to be referred to a provider to establish care? No .   Dental Screening: Recommended annual dental exams for proper oral hygiene    Community Resource Referral / Chronic Care Management: CRR required this visit?  No   CCM required this visit?  No     Plan:     I have  personally reviewed and noted the following in the patient's chart:   Medical and social history Use of alcohol, tobacco or illicit drugs  Current medications and supplements including opioid prescriptions. Patient is not currently taking opioid prescriptions. Functional ability and status Nutritional status Physical activity Advanced directives List of other physicians Hospitalizations, surgeries, and ER visits in previous 12 months Vitals Screenings to include cognitive, depression, and falls Referrals and appointments  In addition, I have reviewed and discussed with patient certain preventive protocols, quality metrics, and best practice recommendations. A written personalized care plan for preventive services as well as general preventive health recommendations were provided to patient.     Sydell Axon, LPN   1/61/0960   After Visit Summary: (MyChart) Due to this being a telephonic visit, the after visit summary with patients personalized plan was offered to patient via MyChart   Nurse Notes: None

## 2022-10-14 ENCOUNTER — Encounter (INDEPENDENT_AMBULATORY_CARE_PROVIDER_SITE_OTHER): Payer: Self-pay

## 2022-11-05 ENCOUNTER — Other Ambulatory Visit (INDEPENDENT_AMBULATORY_CARE_PROVIDER_SITE_OTHER): Payer: Medicare Other

## 2022-11-05 DIAGNOSIS — R5383 Other fatigue: Secondary | ICD-10-CM

## 2022-11-05 DIAGNOSIS — K219 Gastro-esophageal reflux disease without esophagitis: Secondary | ICD-10-CM | POA: Diagnosis not present

## 2022-11-05 DIAGNOSIS — Z13220 Encounter for screening for lipoid disorders: Secondary | ICD-10-CM

## 2022-11-05 LAB — CBC WITH DIFFERENTIAL/PLATELET
Basophils Absolute: 0.1 K/uL (ref 0.0–0.1)
Basophils Relative: 0.8 % (ref 0.0–3.0)
Eosinophils Absolute: 0.1 K/uL (ref 0.0–0.7)
Eosinophils Relative: 1.2 % (ref 0.0–5.0)
HCT: 38.9 % (ref 36.0–46.0)
Hemoglobin: 12.5 g/dL (ref 12.0–15.0)
Lymphocytes Relative: 33.3 % (ref 12.0–46.0)
Lymphs Abs: 2.4 K/uL (ref 0.7–4.0)
MCHC: 32.2 g/dL (ref 30.0–36.0)
MCV: 95.6 fl (ref 78.0–100.0)
Monocytes Absolute: 0.7 K/uL (ref 0.1–1.0)
Monocytes Relative: 9 % (ref 3.0–12.0)
Neutro Abs: 4 K/uL (ref 1.4–7.7)
Neutrophils Relative %: 55.7 % (ref 43.0–77.0)
Platelets: 420 K/uL — ABNORMAL HIGH (ref 150.0–400.0)
RBC: 4.07 Mil/uL (ref 3.87–5.11)
RDW: 13 % (ref 11.5–15.5)
WBC: 7.2 K/uL (ref 4.0–10.5)

## 2022-11-05 LAB — COMPREHENSIVE METABOLIC PANEL
ALT: 19 U/L (ref 0–35)
AST: 20 U/L (ref 0–37)
Albumin: 4.4 g/dL (ref 3.5–5.2)
Alkaline Phosphatase: 93 U/L (ref 39–117)
BUN: 19 mg/dL (ref 6–23)
CO2: 29 meq/L (ref 19–32)
Calcium: 9.9 mg/dL (ref 8.4–10.5)
Chloride: 101 meq/L (ref 96–112)
Creatinine, Ser: 0.89 mg/dL (ref 0.40–1.20)
GFR: 66.36 mL/min (ref >60.00)
Glucose, Bld: 77 mg/dL (ref 70–99)
Potassium: 4.7 meq/L (ref 3.5–5.1)
Sodium: 138 meq/L (ref 135–145)
Total Bilirubin: 0.4 mg/dL (ref 0.2–1.2)
Total Protein: 7.2 g/dL (ref 6.0–8.3)

## 2022-11-05 LAB — LIPID PANEL
Cholesterol: 191 mg/dL (ref 0–200)
HDL: 84.4 mg/dL (ref >39.00)
LDL Cholesterol: 89 mg/dL (ref 0–99)
NonHDL: 107.01
Total CHOL/HDL Ratio: 2
Triglycerides: 91 mg/dL (ref 0.0–149.0)
VLDL: 18.2 mg/dL (ref 0.0–40.0)

## 2022-11-05 LAB — TSH: TSH: 1 u[IU]/mL (ref 0.35–5.50)

## 2022-11-09 ENCOUNTER — Encounter: Payer: Self-pay | Admitting: Internal Medicine

## 2022-11-09 ENCOUNTER — Ambulatory Visit (INDEPENDENT_AMBULATORY_CARE_PROVIDER_SITE_OTHER): Payer: Medicare Other | Admitting: Internal Medicine

## 2022-11-09 VITALS — BP 124/74 | HR 96 | Temp 98.2°F | Ht 61.0 in | Wt 136.4 lb

## 2022-11-09 DIAGNOSIS — Z0000 Encounter for general adult medical examination without abnormal findings: Secondary | ICD-10-CM | POA: Diagnosis not present

## 2022-11-09 DIAGNOSIS — F439 Reaction to severe stress, unspecified: Secondary | ICD-10-CM

## 2022-11-09 DIAGNOSIS — K219 Gastro-esophageal reflux disease without esophagitis: Secondary | ICD-10-CM

## 2022-11-09 DIAGNOSIS — Z1231 Encounter for screening mammogram for malignant neoplasm of breast: Secondary | ICD-10-CM | POA: Diagnosis not present

## 2022-11-09 DIAGNOSIS — D75839 Thrombocytosis, unspecified: Secondary | ICD-10-CM | POA: Insufficient documentation

## 2022-11-09 DIAGNOSIS — Z8742 Personal history of other diseases of the female genital tract: Secondary | ICD-10-CM

## 2022-11-09 NOTE — Progress Notes (Signed)
Subjective:    [name], female    DOB: 1955-07-17, 67 y.o.   MRN: [id number]  Patient here for  Chief Complaint  Patient presents with   Annual Exam    HPI Here for a physical exam. Recently evaluated for right hip/leg pain.  Saw ortho.  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 Referred to physiatry.  Saw Whitney Meeler 08/19/22. Recommended ESI and increased gabapentin to 300mg  bid. S/p ESI 09/08/22.  Had f/u with [facility] 09/29/22.  No relief with ESI. Recommended EMG at [facility] and PT. Going to PT now.  Has NCS scheduled for this week. Also increased gabapentin to tid.can't tell a big difference. Tramadol for acute pain. Saw GI f/u 06/2021. Recommended EGD and colonoscopy. Had procedures. EGD - hiatal hernia. Recommended f/u colonoscopy in 10 years. On pepcid.  No chest pain or sob reported.  No abdominal pain or bowel change reported.  Some increased stress.  Appears to be handling things relatively well.    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   Osteoporosis    Vitamin D deficiency    Past Surgical History:  Procedure Laterality Date   BIOPSY  07/31/2020   Procedure: BIOPSY;  Surgeon: Lemar Lofty., MD;  Location: [facility] ENDOSCOPY;  Service: Gastroenterology;;   BREAST EXCISIONAL BIOPSY Left 1989   benign   BREAST SURGERY  1989   left breast biopsy   COLONOSCOPY     x 2   ESOPHAGOGASTRODUODENOSCOPY (EGD) WITH PROPOFOL N/A 05/30/2020   Procedure: ESOPHAGOGASTRODUODENOSCOPY (EGD) WITH PROPOFOL;  Surgeon: Pasty Spillers, MD;  Location: [facility] ENDOSCOPY;  Service: Endoscopy;  Laterality: N/A;   ESOPHAGOGASTRODUODENOSCOPY (EGD) WITH PROPOFOL N/A 07/31/2020   Procedure: ESOPHAGOGASTRODUODENOSCOPY (EGD) WITH PROPOFOL;  Surgeon: Meridee Score Netty Starring., MD;  Location: [facility] ENDOSCOPY;   Service: Gastroenterology;  Laterality: N/A;   EYE SURGERY Bilateral 1999   lasik   POLYPECTOMY  07/31/2020   Procedure: POLYPECTOMY;  Surgeon: Mansouraty, Netty Starring., MD;  Location: [facility] ENDOSCOPY;  Service: Gastroenterology;;   TUBAL LIGATION     WISDOM TOOTH EXTRACTION     Family History  Problem Relation Age of Onset   Breast cancer Mother 80       died age 74   Parkinson's disease Father    Lymphoma Maternal Grandmother        non-hodgkin's   Ovarian cancer Neg Hx    Diabetes Neg Hx    Colon cancer Neg Hx    Heart disease Neg Hx    Social History   Socioeconomic History   Marital status: Divorced    Spouse name: Not on file   Number of children: 1   Years of education: Not on file   Highest education level: Some college, no degree  Occupational History   Not on file  Tobacco Use   Smoking status: Never   Smokeless tobacco: Never  Vaping Use   Vaping status: Never Used  Substance and Sexual Activity   Alcohol use: Not Currently    Comment: occasional   Drug use: No   Sexual activity: Yes    Birth control/protection: Surgical, Post-menopausal    Comment: Tubal Ligation  Other Topics Concern   Not on file  Social History Narrative   Not on file   Social Determinants of Health   Financial Resource Strain:  Low Risk  (09/13/2022)   Overall Financial Resource Strain (CARDIA)    Difficulty of Paying Living Expenses: Not hard at all  Food Insecurity: No Food Insecurity (09/13/2022)   Hunger Vital Sign    Worried About Running Out of Food in the Last Year: Never true    Ran Out of Food in the Last Year: Never true  Transportation Needs: No Transportation Needs (09/13/2022)   PRAPARE - Administrator, Civil Service (Medical): No    Lack of Transportation (Non-Medical): No  Physical Activity: Inactive (09/13/2022)   Exercise Vital Sign    Days of Exercise per Week: 0 days    Minutes of Exercise per Session: 0 min  Stress: No Stress Concern Present  (09/13/2022)   [employer] of Occupational Health - Occupational Stress Questionnaire    Feeling of Stress : Not at all  Social Connections: Moderately Integrated (09/13/2022)   Social Connection and Isolation Panel [NHANES]    Frequency of Communication with Friends and Family: More than three times a week    Frequency of Social Gatherings with Friends and Family: More than three times a week    Attends Religious Services: More than 4 times per year    Active Member of [employer] or Organizations: Yes    Attends Engineer, structural: More than 4 times per year    Marital Status: Divorced  Recent Concern: Social Connections - Moderately Isolated (09/13/2022)   Social Connection and Isolation Panel [NHANES]    Frequency of Communication with Friends and Family: More than three times a week    Frequency of Social Gatherings with Friends and Family: More than three times a week    Attends Religious Services: Never    Database administrator or Organizations: Yes    Attends Engineer, structural: More than 4 times per year    Marital Status: Divorced     Review of Systems  Constitutional:  Negative for appetite change and unexpected weight change.  HENT:  Negative for congestion, sinus pressure and sore throat.   Eyes:  Negative for pain and visual disturbance.  Respiratory:  Negative for cough, chest tightness and shortness of breath.   Cardiovascular:  Negative for chest pain, palpitations and leg swelling.  Gastrointestinal:  Negative for abdominal pain, diarrhea, nausea and vomiting.  Genitourinary:  Negative for difficulty urinating and dysuria.  Musculoskeletal:  Positive for back pain. Negative for joint swelling and myalgias.  Skin:  Negative for color change and rash.  Neurological:  Negative for dizziness and headaches.  Hematological:  Negative for adenopathy. Does not bruise/bleed easily.  Psychiatric/Behavioral:  Negative for agitation and dysphoric mood.         Objective:     BP 124/74   Pulse 96   Temp 98.2 F (36.8 C) (Oral)   Ht 5\' 1"  (1.549 m)   Wt 136 lb 6.4 oz (61.9 kg)   LMP  (LMP Unknown)   SpO2 100%   BMI 25.77 kg/m  Wt Readings from Last 3 Encounters:  11/09/22 136 lb 6.4 oz (61.9 kg)  09/13/22 132 lb (59.9 kg)  08/12/22 133 lb (60.3 kg)    Physical Exam Vitals reviewed.  Constitutional:      General: She is not in acute distress.    Appearance: Normal appearance. She is well-developed.  HENT:     Head: Normocephalic and atraumatic.     Right Ear: External ear normal.     Left Ear: External ear  normal.  Eyes:     General: No scleral icterus.       Right eye: No discharge.        Left eye: No discharge.     Conjunctiva/sclera: Conjunctivae normal.  Neck:     Thyroid: No thyromegaly.  Cardiovascular:     Rate and Rhythm: Normal rate and regular rhythm.  Pulmonary:     Effort: No tachypnea, accessory muscle usage or respiratory distress.     Breath sounds: Normal breath sounds. No decreased breath sounds or wheezing.  Chest:  Breasts:    Right: No inverted nipple, mass, nipple discharge or tenderness (no axillary adenopathy).     Left: No inverted nipple, mass, nipple discharge or tenderness (no axilarry adenopathy).  Abdominal:     General: Bowel sounds are normal.     Palpations: Abdomen is soft.     Tenderness: There is no abdominal tenderness.  Musculoskeletal:        General: No swelling or tenderness.     Cervical back: Neck supple.     Comments: Increased back pain when going from lying to sitting position.   Lymphadenopathy:     Cervical: No cervical adenopathy.  Skin:    Findings: No erythema or rash.  Neurological:     Mental Status: She is alert and oriented to person, place, and time.  Psychiatric:        Mood and Affect: Mood normal.        Behavior: Behavior normal.      Outpatient Encounter Medications as of 11/09/2022  Medication Sig   gabapentin (NEURONTIN) 300 MG capsule  Take 300 mg by mouth 3 (three) times daily.   Ascorbic Acid (VITAMIN C) 1000 MG tablet Take 1,000 mg by mouth daily.   calcium carbonate (OS-CAL - DOSED IN MG OF ELEMENTAL CALCIUM) 1250 (500 Ca) MG tablet Take 1 tablet by mouth daily with breakfast.   famotidine (PEPCID) 20 MG tablet Take 1 tablet (20 mg total) by mouth 2 (two) times daily.   Multiple Vitamins-Minerals (MULTIVITAMIN PO) Take 1 tablet by mouth daily.   Vitamin D, Cholecalciferol, 25 MCG (1000 UT) CAPS Take 1,000 Units by mouth daily.   zinc gluconate 50 MG tablet Take 50 mg by mouth daily.   [DISCONTINUED] gabapentin (NEURONTIN) 100 MG capsule 2 tablets q hs (Patient not taking: Reported on 09/13/2022)   [DISCONTINUED] triamcinolone cream (KENALOG) 0.1 % Apply 1 application topically 2 (two) times daily. (Patient not taking: Reported on 09/13/2022)   No facility-administered encounter medications on file as of 11/09/2022.     Lab Results  Component Value Date   WBC 7.2 11/05/2022   HGB 12.5 11/05/2022   HCT 38.9 11/05/2022   PLT 420.0 (H) 11/05/2022   GLUCOSE 77 11/05/2022   CHOL 191 11/05/2022   TRIG 91.0 11/05/2022   HDL 84.40 11/05/2022   LDLCALC 89 11/05/2022   ALT 19 11/05/2022   AST 20 11/05/2022   NA 138 11/05/2022   K 4.7 11/05/2022   CL 101 11/05/2022   CREATININE 0.89 11/05/2022   BUN 19 11/05/2022   CO2 29 11/05/2022   TSH 1.00 11/05/2022    MR LUMBAR SPINE WO CONTRAST  Result Date: 08/07/2022 CLINICAL DATA:  Six months of right-sided low back pain radiating to the legs EXAM: MRI LUMBAR SPINE WITHOUT CONTRAST TECHNIQUE: Multiplanar, multisequence MR imaging of the lumbar spine was performed. No intravenous contrast was administered. COMPARISON:  None Available. FINDINGS: Segmentation:  Standard. Alignment:  Mild degenerative anterolisthesis  at L4-5 Vertebrae: Marrow edema at the left L4-5 facet. No fracture or aggressive bone lesion Conus medullaris and cauda equina: Conus extends to the T12-L1 level. Conus  and cauda equina appear normal. Paraspinal and other soft tissues: Negative for perispinal mass or inflammation Disc levels: T12- L1: Unremarkable. L1-L2: Unremarkable. L2-L3: Foraminal predominant disc bulging.  No neural compression L3-L4: Foraminal predominant disc bulging.  Mild facet spurring L4-L5: Bulky degenerative facet spurring. Anterolisthesis and disc bulging. Mild triangular narrowing the thecal sac. L5-S1:Greatest level of degenerative disc narrowing. Mild endplate and facet spurring. 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 Electronically Signed   By: Tiburcio Pea M.D.   On: 08/07/2022 09:43       Assessment & Plan:  Routine general medical examination at a health care facility  Visit for screening mammogram -     3D Screening Mammogram, Left and Right; Future  Healthcare maintenance Assessment & Plan: Physical today 11/09/22.  PAP 11/06/20 - negative with negative HPV. Mammogram 11/30/21 - birads I.  Scheduled for f/u mammogram. Colonoscopy 08/17/21 (Dr Norma Fredrickson) - normal.  Per 07/2021 note recommended f/u in 10 years.    Thrombocytosis Assessment & Plan: Platelet count slightly elevated.  Recheck in one month.  Remainder of cbc wnl.   Orders: -     CBC with Differential/Platelet; Future  Gastroesophageal reflux disease, unspecified whether esophagitis present Assessment & Plan: Continue pepcid.  Follow.    History of abnormal cervical Pap smear Assessment & Plan: PAP 11/06/20 - negative with negative HPV.    Stress Assessment & Plan: Overall appears to be handling things well.        Dale Hickory, MD

## 2022-11-09 NOTE — Assessment & Plan Note (Addendum)
Physical today 11/09/22.  PAP 11/06/20 - negative with negative HPV. Mammogram 11/30/21 - birads I.  Scheduled for f/u mammogram. Colonoscopy 08/17/21 (Dr Norma Fredrickson) - normal.  Per 07/2021 note recommended f/u in 10 years.

## 2022-11-09 NOTE — Assessment & Plan Note (Signed)
Continue pepcid.  Follow.  

## 2022-11-09 NOTE — Assessment & Plan Note (Signed)
PAP 11/06/20 - negative with negative HPV.  

## 2022-11-09 NOTE — Assessment & Plan Note (Signed)
Overall appears to be handling things well.   

## 2022-11-09 NOTE — Assessment & Plan Note (Signed)
Platelet count slightly elevated.  Recheck in one month.  Remainder of cbc wnl.

## 2022-11-13 ENCOUNTER — Encounter: Payer: Self-pay | Admitting: Internal Medicine

## 2022-11-15 NOTE — Telephone Encounter (Signed)
Patient has not had bone density since 2021- dx with osteoporosis. Should we repeat a dexa? See her message below.

## 2022-11-15 NOTE — Telephone Encounter (Signed)
If agreeable, ok to schedule bone density to evaluate osteoporosis.

## 2022-11-17 NOTE — Telephone Encounter (Signed)
We can see what the bone density reveals, but if the therapist had concerns, would hold until can get results.

## 2022-11-18 ENCOUNTER — Other Ambulatory Visit: Payer: Self-pay

## 2022-11-18 DIAGNOSIS — E2839 Other primary ovarian failure: Secondary | ICD-10-CM

## 2022-11-18 DIAGNOSIS — M810 Age-related osteoporosis without current pathological fracture: Secondary | ICD-10-CM

## 2022-12-07 ENCOUNTER — Other Ambulatory Visit (INDEPENDENT_AMBULATORY_CARE_PROVIDER_SITE_OTHER): Payer: Medicare Other

## 2022-12-07 ENCOUNTER — Ambulatory Visit
Admission: RE | Admit: 2022-12-07 | Discharge: 2022-12-07 | Disposition: A | Discharge status: Home / Self Care | Payer: Medicare Other | Source: Ambulatory Visit | Attending: Internal Medicine | Admitting: Internal Medicine

## 2022-12-07 DIAGNOSIS — D75839 Thrombocytosis, unspecified: Secondary | ICD-10-CM

## 2022-12-07 DIAGNOSIS — Z1231 Encounter for screening mammogram for malignant neoplasm of breast: Secondary | ICD-10-CM | POA: Diagnosis present

## 2022-12-07 LAB — CBC WITH DIFFERENTIAL/PLATELET
Basophils Absolute: 0.1 10*3/uL (ref 0.0–0.1)
Basophils Relative: 0.7 % (ref 0.0–3.0)
Eosinophils Absolute: 0 10*3/uL (ref 0.0–0.7)
Eosinophils Relative: 0.6 % (ref 0.0–5.0)
HCT: 38.7 % (ref 36.0–46.0)
Hemoglobin: 12.5 g/dL (ref 12.0–15.0)
Lymphocytes Relative: 28.2 % (ref 12.0–46.0)
Lymphs Abs: 2.2 10*3/uL (ref 0.7–4.0)
MCHC: 32.3 g/dL (ref 30.0–36.0)
MCV: 95.5 fL (ref 78.0–100.0)
Monocytes Absolute: 0.7 10*3/uL (ref 0.1–1.0)
Monocytes Relative: 8.8 % (ref 3.0–12.0)
Neutro Abs: 4.9 10*3/uL (ref 1.4–7.7)
Neutrophils Relative %: 61.7 % (ref 43.0–77.0)
Platelets: 384 10*3/uL (ref 150.0–400.0)
RBC: 4.05 Mil/uL (ref 3.87–5.11)
RDW: 13 % (ref 11.5–15.5)
WBC: 7.9 10*3/uL (ref 4.0–10.5)

## 2022-12-24 ENCOUNTER — Ambulatory Visit
Admission: RE | Admit: 2022-12-24 | Discharge: 2022-12-24 | Disposition: A | Discharge status: Home / Self Care | Payer: Medicare Other | Source: Ambulatory Visit | Attending: Internal Medicine | Admitting: Internal Medicine

## 2022-12-24 DIAGNOSIS — M810 Age-related osteoporosis without current pathological fracture: Secondary | ICD-10-CM | POA: Diagnosis present

## 2022-12-27 ENCOUNTER — Telehealth: Payer: Self-pay

## 2022-12-27 NOTE — Telephone Encounter (Signed)
-----   Message from [place] sent at 12/26/2022  8:48 PM EDT ----- Notify - bone density reveals osteoporosis.  Please schedule an appt to discuss treatment options.

## 2022-12-27 NOTE — Telephone Encounter (Signed)
Lvm for pt to give office a call back to schedule an appt  see message below

## 2022-12-28 ENCOUNTER — Encounter: Payer: Self-pay | Admitting: Internal Medicine

## 2022-12-28 ENCOUNTER — Ambulatory Visit (INDEPENDENT_AMBULATORY_CARE_PROVIDER_SITE_OTHER): Payer: Medicare Other | Admitting: Internal Medicine

## 2022-12-28 VITALS — BP 130/70 | HR 90 | Temp 98.0°F | Resp 16 | Ht 61.0 in | Wt 136.0 lb

## 2022-12-28 DIAGNOSIS — M810 Age-related osteoporosis without current pathological fracture: Secondary | ICD-10-CM

## 2022-12-28 DIAGNOSIS — E559 Vitamin D deficiency, unspecified: Secondary | ICD-10-CM | POA: Diagnosis not present

## 2022-12-28 DIAGNOSIS — F439 Reaction to severe stress, unspecified: Secondary | ICD-10-CM

## 2022-12-28 NOTE — Progress Notes (Signed)
Subjective:    [name], female    DOB: 1956/02/24, 67 y.o.   MRN: [id number]  Patient here for  Chief Complaint  Patient presents with   Osteoporosis    HPI Here for work in appt.  Work in to discuss treatment options for osteoporosis. Discussed her recent bone density results with comparison to last bone density.  AP spine T score - (-3.6) compared to (-2.9) in 2021. Total right - (-3.3) compared to (-2.9) and total mean (-3.1) compared to - (-2.6) in 2021.  Discussed treatment options.  States was first diagnosed in 11.  Reports was on fosamax for 10 years.  Has been off since. Discussed oral bisphosphonates, reclast and prolia.  Discussed calcium and vitamin D and weight bearing exercise.  Discussed medications and possible side effects and benefits.    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   Osteoporosis    Vitamin D deficiency    Past Surgical History:  Procedure Laterality Date   BIOPSY  07/31/2020   Procedure: BIOPSY;  Surgeon: Lemar Lofty., MD;  Location: [facility] ENDOSCOPY;  Service: Gastroenterology;;   BREAST EXCISIONAL BIOPSY Left 1989   benign   BREAST SURGERY  1989   left breast biopsy   COLONOSCOPY     x 2   ESOPHAGOGASTRODUODENOSCOPY (EGD) WITH PROPOFOL N/A 05/30/2020   Procedure: ESOPHAGOGASTRODUODENOSCOPY (EGD) WITH PROPOFOL;  Surgeon: Pasty Spillers, MD;  Location: [facility] ENDOSCOPY;  Service: Endoscopy;  Laterality: N/A;   ESOPHAGOGASTRODUODENOSCOPY (EGD) WITH PROPOFOL N/A 07/31/2020   Procedure: ESOPHAGOGASTRODUODENOSCOPY (EGD) WITH PROPOFOL;  Surgeon: Meridee Score Netty Starring., MD;  Location: [facility] ENDOSCOPY;  Service: Gastroenterology;  Laterality: N/A;   EYE SURGERY Bilateral 1999   lasik   POLYPECTOMY  07/31/2020   Procedure: POLYPECTOMY;  Surgeon: Mansouraty, Netty Starring., MD;  Location: [facility] ENDOSCOPY;  Service: Gastroenterology;;   TUBAL  LIGATION     WISDOM TOOTH EXTRACTION     Family History  Problem Relation Age of Onset   Breast cancer Mother 58       died age 83   Parkinson's disease Father    Lymphoma Maternal Grandmother        non-hodgkin's   Ovarian cancer Neg Hx    Diabetes Neg Hx    Colon cancer Neg Hx    Heart disease Neg Hx    Social History   Socioeconomic History   Marital status: Divorced    Spouse name: Not on file   Number of children: 1   Years of education: Not on file   Highest education level: Some college, no degree  Occupational History   Not on file  Tobacco Use   Smoking status: Never   Smokeless tobacco: Never  Vaping Use   Vaping status: Never Used  Substance and Sexual Activity   Alcohol use: Not Currently    Comment: occasional   Drug use: No   Sexual activity: Yes    Birth control/protection: Surgical, Post-menopausal    Comment: Tubal Ligation  Other Topics Concern   Not on file  Social History Narrative   Not on file   Social Determinants of Health   Financial Resource Strain: Low Risk  (09/13/2022)   Overall Financial Resource Strain (CARDIA)    Difficulty of Paying Living Expenses: Not hard at all  Food Insecurity: No Food Insecurity (09/13/2022)   Hunger Vital Sign    Worried About Running  Out of Food in the Last Year: Never true    Ran Out of Food in the Last Year: Never true  Transportation Needs: No Transportation Needs (09/13/2022)   PRAPARE - Administrator, Civil Service (Medical): No    Lack of Transportation (Non-Medical): No  Physical Activity: Inactive (09/13/2022)   Exercise Vital Sign    Days of Exercise per Week: 0 days    Minutes of Exercise per Session: 0 min  Stress: No Stress Concern Present (09/13/2022)   [employer] of Occupational Health - Occupational Stress Questionnaire    Feeling of Stress : Not at all  Social Connections: Moderately Integrated (09/13/2022)   Social Connection and Isolation Panel [NHANES]    Frequency  of Communication with Friends and Family: More than three times a week    Frequency of Social Gatherings with Friends and Family: More than three times a week    Attends Religious Services: More than 4 times per year    Active Member of [employer] or Organizations: Yes    Attends Engineer, structural: More than 4 times per year    Marital Status: Divorced  Recent Concern: Social Connections - Moderately Isolated (09/13/2022)   Social Connection and Isolation Panel [NHANES]    Frequency of Communication with Friends and Family: More than three times a week    Frequency of Social Gatherings with Friends and Family: More than three times a week    Attends Religious Services: Never    Database administrator or Organizations: Yes    Attends Engineer, structural: More than 4 times per year    Marital Status: Divorced     Review of Systems  Constitutional:  Negative for appetite change and unexpected weight change.  HENT:  Negative for congestion and sinus pressure.   Respiratory:  Negative for cough, chest tightness and shortness of breath.   Cardiovascular:  Negative for chest pain and palpitations.  Gastrointestinal:  Negative for abdominal pain, diarrhea, nausea and vomiting.  Genitourinary:  Negative for difficulty urinating and dysuria.  Musculoskeletal:  Negative for joint swelling and myalgias.  Skin:  Negative for color change and rash.  Neurological:  Negative for dizziness and headaches.  Psychiatric/Behavioral:  Negative for agitation and dysphoric mood.        Objective:     BP 130/70   Pulse 90   Temp 98 F (36.7 C)   Resp 16   Ht 5\' 1"  (1.549 m)   Wt 136 lb (61.7 kg)   LMP  (LMP Unknown)   SpO2 98%   BMI 25.70 kg/m  Wt Readings from Last 3 Encounters:  12/28/22 136 lb (61.7 kg)  11/09/22 136 lb 6.4 oz (61.9 kg)  09/13/22 132 lb (59.9 kg)    Physical Exam Vitals reviewed.  Constitutional:      General: She is not in acute distress.     Appearance: Normal appearance.  HENT:     Head: Normocephalic and atraumatic.     Right Ear: External ear normal.     Left Ear: External ear normal.  Eyes:     General: No scleral icterus.       Right eye: No discharge.        Left eye: No discharge.     Conjunctiva/sclera: Conjunctivae normal.  Neck:     Thyroid: No thyromegaly.  Cardiovascular:     Rate and Rhythm: Normal rate and regular rhythm.  Pulmonary:     Effort: No  respiratory distress.     Breath sounds: Normal breath sounds. No wheezing.  Abdominal:     General: Bowel sounds are normal.     Palpations: Abdomen is soft.     Tenderness: There is no abdominal tenderness.  Musculoskeletal:        General: No swelling or tenderness.     Cervical back: Neck supple. No tenderness.  Lymphadenopathy:     Cervical: No cervical adenopathy.  Skin:    Findings: No erythema or rash.  Neurological:     Mental Status: She is alert.  Psychiatric:        Mood and Affect: Mood normal.        Behavior: Behavior normal.      Outpatient Encounter Medications as of 12/28/2022  Medication Sig   Ascorbic Acid (VITAMIN C) 1000 MG tablet Take 1,000 mg by mouth daily.   calcium carbonate (OS-CAL - DOSED IN MG OF ELEMENTAL CALCIUM) 1250 (500 Ca) MG tablet Take 1 tablet by mouth daily with breakfast.   famotidine (PEPCID) 20 MG tablet Take 1 tablet (20 mg total) by mouth 2 (two) times daily.   Multiple Vitamins-Minerals (MULTIVITAMIN PO) Take 1 tablet by mouth daily.   Vitamin D, Cholecalciferol, 25 MCG (1000 UT) CAPS Take 1,000 Units by mouth daily.   zinc gluconate 50 MG tablet Take 50 mg by mouth daily.   [DISCONTINUED] gabapentin (NEURONTIN) 300 MG capsule Take 300 mg by mouth 3 (three) times daily.   No facility-administered encounter medications on file as of 12/28/2022.     Lab Results  Component Value Date   WBC 7.9 12/07/2022   HGB 12.5 12/07/2022   HCT 38.7 12/07/2022   PLT 384.0 12/07/2022   GLUCOSE 77 11/05/2022    CHOL 191 11/05/2022   TRIG 91.0 11/05/2022   HDL 84.40 11/05/2022   LDLCALC 89 11/05/2022   ALT 19 11/05/2022   AST 20 11/05/2022   NA 138 11/05/2022   K 4.7 11/05/2022   CL 101 11/05/2022   CREATININE 0.89 11/05/2022   BUN 19 11/05/2022   CO2 29 11/05/2022   TSH 1.00 11/05/2022    DG Bone Density  Result Date: 12/24/2022 EXAM: DUAL X-RAY ABSORPTIOMETRY (DXA) FOR BONE MINERAL DENSITY IMPRESSION: Your patient Autumn Wolfe completed a BMD test on 12/24/2022 using the [employer] iDXA DXA System (software version: 14.10) manufactured by [employer]. The following summarizes the results of our evaluation. Technologist: SCE PATIENT BIOGRAPHICAL: Name: Autumn Wolfe, Autumn Wolfe Patient ID: [id number] Birth Date: [birth year] Height: 61.0 in. Gender: Female Exam Date: 12/24/2022 Weight: 133.5 lbs. Indications: Acid Reflux, Caucasian, Height Loss, Parent Hip Fracture, Postmenopausal, Vitamin D Deficiency Fractures: Treatments: Calcium, Vitamin D DENSITOMETRY RESULTS: Site      Region      Measured Date Measured Age WHO Classification Young Adult T-score BMD         %Change vs. Previous Significant Change (*) AP Spine L1-L4 12/24/20[age].0 Osteoporosis -3.6 0.751 g/cm2 -10.1% Yes AP Spine L1-L4 09/18/2019 65.8 Osteoporosis -2.9 0.835 g/cm2 - - DualFemur Total Right 12/24/2022 69.0 Osteoporosis -3.3 0.596 g/cm2 -7.0% Yes DualFemur Total Right 09/18/2019 65.8 Osteoporosis -2.9 0.641 g/cm2 - - DualFemur Total Mean 12/24/2022 69.0 Osteoporosis -3.1 0.615 g/cm2 -9.6% Yes DualFemur Total Mean 09/18/2019 65.8 Osteoporosis -2.6 0.680 g/cm2 - - ASSESSMENT: The BMD measured at AP Spine L1-L4 is 0.751 g/cm2 with a T-score of -3.6. This patient is considered osteoporotic according to World Health Organization [facility]) criteria. The scan quality is good. Compared with prior  study, there has been a significant decrease in the spine. Compared with prior study, there has been a significant decrease in the total hip. World  Science writer [facility]) criteria for post-menopausal, Caucasian Women: Normal:                   T-score at or above -1 SD Osteopenia/low bone mass: T-score between -1 and -2.5 SD Osteoporosis:             T-score at or below -2.5 SD RECOMMENDATIONS: 1. All patients should optimize calcium and vitamin D intake. 2. Consider FDA-approved medical therapies in postmenopausal women and men aged 37 years and older, based on the following: a. A hip or vertebral(clinical or morphometric) fracture b. T-score < -2.5 at the femoral neck or spine after appropriate evaluation to exclude secondary causes c. Low bone mass (T-score between -1.0 and -2.5 at the femoral neck or spine) and a 10-year probability of a hip fracture > 3% or a 10-year probability of a major osteoporosis-related fracture > 20% based on the [place]-adapted WHO algorithm 3. Clinician judgment and/or patient preferences may indicate treatment for people with 10-year fracture probabilities above or below these levels FOLLOW-UP: People with diagnosed cases of osteoporosis or at high risk for fracture should have regular bone mineral density tests. For patients eligible for Medicare, routine testing is allowed once every 2 years. The testing frequency can be increased to one year for patients who have rapidly progressing disease, those who are receiving or discontinuing medical therapy to restore bone mass, or have additional risk factors. I have reviewed this report, and agree with the above findings. [facility] Electronically Signed   By: Romona Curls M.D.   On: 12/24/2022 10:43       Assessment & Plan:  Vitamin D deficiency Assessment & Plan: Will check vitamin D level with next labs.    Stress Assessment & Plan: Overall appears to be handling things well.     Osteoporosis without current pathological fracture, unspecified osteoporosis type Assessment & Plan: Calcium, vitamin D.  Weight bearing exercise as tolerated.  Discussed  follow-up bone density and results as outlined.  Discussed treatment options and possible side effects and benefits to these medications.  She has been on oral Fosamax for 10 years previously.  After discussion, she elected not to start prescription medications.  Will continue her calcium vitamin D and weightbearing exercise.      Dale Campbell, MD

## 2023-01-02 ENCOUNTER — Encounter: Payer: Self-pay | Admitting: Internal Medicine

## 2023-01-02 NOTE — Assessment & Plan Note (Signed)
Overall appears to be handling things well.   

## 2023-01-02 NOTE — Assessment & Plan Note (Signed)
Will check vitamin D level with next labs.

## 2023-01-02 NOTE — Assessment & Plan Note (Signed)
Calcium, vitamin D.  Weight bearing exercise as tolerated.  Discussed follow-up bone density and results as outlined.  Discussed treatment options and possible side effects and benefits to these medications.  She has been on oral Fosamax for 10 years previously.  After discussion, she elected not to start prescription medications.  Will continue her calcium vitamin D and weightbearing exercise.

## 2023-04-13 ENCOUNTER — Other Ambulatory Visit: Payer: Self-pay

## 2023-04-13 ENCOUNTER — Encounter: Payer: Self-pay | Admitting: Ophthalmology

## 2023-04-26 ENCOUNTER — Ambulatory Visit: Payer: Medicare Other | Admitting: Anesthesiology

## 2023-04-26 ENCOUNTER — Encounter
Admission: RE | Disposition: A | Discharge status: Home / Self Care | Payer: Self-pay | Source: Home / Self Care | Attending: Ophthalmology

## 2023-04-26 ENCOUNTER — Ambulatory Visit
Admission: RE | Admit: 2023-04-26 | Discharge: 2023-04-26 | Disposition: A | Discharge status: Home / Self Care | Payer: Medicare Other | Attending: Ophthalmology | Admitting: Ophthalmology

## 2023-04-26 ENCOUNTER — Other Ambulatory Visit: Payer: Self-pay

## 2023-04-26 ENCOUNTER — Encounter: Payer: Self-pay | Admitting: Ophthalmology

## 2023-04-26 DIAGNOSIS — H2511 Age-related nuclear cataract, right eye: Secondary | ICD-10-CM | POA: Insufficient documentation

## 2023-04-26 DIAGNOSIS — K219 Gastro-esophageal reflux disease without esophagitis: Secondary | ICD-10-CM | POA: Diagnosis not present

## 2023-04-26 HISTORY — DX: Thrombocytosis, unspecified: D75.839

## 2023-04-26 HISTORY — PX: CATARACT EXTRACTION W/PHACO: SHX586

## 2023-04-26 SURGERY — PHACOEMULSIFICATION, CATARACT, WITH IOL INSERTION
Anesthesia: Monitor Anesthesia Care | Site: Eye | Laterality: Right

## 2023-04-26 MED ORDER — ARMC OPHTHALMIC DILATING DROPS
OPHTHALMIC | Status: AC
  Filled 2023-04-26: qty 0.5

## 2023-04-26 MED ORDER — TETRACAINE HCL 0.5 % OP SOLN
OPHTHALMIC | Status: AC
  Filled 2023-04-26: qty 4

## 2023-04-26 MED ORDER — MOXIFLOXACIN HCL 0.5 % OP SOLN
OPHTHALMIC | Status: DC | PRN
  Administered 2023-04-26: .2 mL via OPHTHALMIC

## 2023-04-26 MED ORDER — SIGHTPATH DOSE#1 NA CHONDROIT SULF-NA HYALURON 40-17 MG/ML IO SOLN
INTRAOCULAR | Status: DC | PRN
  Administered 2023-04-26: 1 mL via INTRAOCULAR

## 2023-04-26 MED ORDER — BRIMONIDINE TARTRATE-TIMOLOL 0.2-0.5 % OP SOLN
OPHTHALMIC | Status: DC | PRN
  Administered 2023-04-26: 1 [drp] via OPHTHALMIC

## 2023-04-26 MED ORDER — MIDAZOLAM HCL 2 MG/2ML IJ SOLN
INTRAMUSCULAR | Status: DC | PRN
  Administered 2023-04-26 (×2): 1 mg via INTRAVENOUS

## 2023-04-26 MED ORDER — TETRACAINE HCL 0.5 % OP SOLN
1.0000 [drp] | OPHTHALMIC | Status: DC | PRN
  Administered 2023-04-26 (×3): 1 [drp] via OPHTHALMIC

## 2023-04-26 MED ORDER — SIGHTPATH DOSE#1 BSS IO SOLN
INTRAOCULAR | Status: DC | PRN
  Administered 2023-04-26: 15 mL via INTRAOCULAR

## 2023-04-26 MED ORDER — FENTANYL CITRATE (PF) 100 MCG/2ML IJ SOLN
INTRAMUSCULAR | Status: AC
  Filled 2023-04-26: qty 2

## 2023-04-26 MED ORDER — SIGHTPATH DOSE#1 BSS IO SOLN
INTRAOCULAR | Status: DC | PRN
  Administered 2023-04-26: 2 mL

## 2023-04-26 MED ORDER — SIGHTPATH DOSE#1 BSS IO SOLN
INTRAOCULAR | Status: DC | PRN
  Administered 2023-04-26: 48 mL via OPHTHALMIC

## 2023-04-26 MED ORDER — ARMC OPHTHALMIC DILATING DROPS
1.0000 | OPHTHALMIC | Status: DC | PRN
  Administered 2023-04-26 (×3): 1 via OPHTHALMIC

## 2023-04-26 MED ORDER — FENTANYL CITRATE (PF) 100 MCG/2ML IJ SOLN
INTRAMUSCULAR | Status: DC | PRN
  Administered 2023-04-26: 50 ug via INTRAVENOUS

## 2023-04-26 MED ORDER — MIDAZOLAM HCL 2 MG/2ML IJ SOLN
INTRAMUSCULAR | Status: AC
  Filled 2023-04-26: qty 2

## 2023-04-26 SURGICAL SUPPLY — 14 items
CANNULA ANT/CHMB 27G (MISCELLANEOUS) IMPLANT
CANNULA ANT/CHMB 27GA (MISCELLANEOUS) IMPLANT
CATARACT SUITE SIGHTPATH (MISCELLANEOUS) ×1 IMPLANT
CYSTOTOME ANG REV CUT SHRT 25G (CUTTER) ×1 IMPLANT
CYSTOTOME ANGL RVRS SHRT 25G (CUTTER) ×1 IMPLANT
FEE CATARACT SUITE SIGHTPATH (MISCELLANEOUS) ×1 IMPLANT
GLOVE BIOGEL PI IND STRL 8 (GLOVE) ×1 IMPLANT
GLOVE SURG LX STRL 8.0 MICRO (GLOVE) ×1 IMPLANT
GLOVE SURG PROTEXIS BL SZ6.5 (GLOVE) ×1 IMPLANT
GLOVE SURG SYN 6.5 PF PI BL (GLOVE) ×1 IMPLANT
LENS IOL TECNIS EYHANCE 21.0 (Intraocular Lens) IMPLANT
NDL FILTER BLUNT 18X1 1/2 (NEEDLE) ×1 IMPLANT
NEEDLE FILTER BLUNT 18X1 1/2 (NEEDLE) ×1 IMPLANT
SYR 3ML LL SCALE MARK (SYRINGE) ×1 IMPLANT

## 2023-04-26 NOTE — Discharge Instructions (Signed)

## 2023-04-26 NOTE — Transfer of Care (Signed)
 Immediate Anesthesia Transfer of Care Note  Patient: Autumn Wolfe  Procedure(s) Performed: CATARACT EXTRACTION PHACO AND INTRAOCULAR LENS PLACEMENT (IOC) RIGHT 7.85 00:46.7 (Right: Eye)  Patient Location: PACU  Anesthesia Type: MAC  Level of Consciousness: awake, alert  and patient cooperative  Airway and Oxygen Therapy: Patient Spontanous Breathing and Patient connected to supplemental oxygen  Post-op Assessment: Post-op Vital signs reviewed, Patient's Cardiovascular Status Stable, Respiratory Function Stable, Patent Airway and No signs of Nausea or vomiting  Post-op Vital Signs: Reviewed and stable  Complications: No notable events documented.

## 2023-04-26 NOTE — H&P (Signed)
 [facility]   Primary Care Physician:  Dale Rennerdale, MD Ophthalmologist: Dr. Druscilla Brownie  Pre-Procedure History & Physical: HPI:  Autumn Wolfe is a 68 y.o. female here for cataract surgery.   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   Osteoporosis    Thrombocytosis    Vitamin D deficiency     Past Surgical History:  Procedure Laterality Date   BIOPSY  07/31/2020   Procedure: BIOPSY;  Surgeon: Lemar Lofty., MD;  Location: [facility] ENDOSCOPY;  Service: Gastroenterology;;   BREAST EXCISIONAL BIOPSY Left 1989   benign   BREAST SURGERY  1989   left breast biopsy   COLONOSCOPY     x 2   ESOPHAGOGASTRODUODENOSCOPY (EGD) WITH PROPOFOL N/A 05/30/2020   Procedure: ESOPHAGOGASTRODUODENOSCOPY (EGD) WITH PROPOFOL;  Surgeon: Pasty Spillers, MD;  Location: [facility] ENDOSCOPY;  Service: Endoscopy;  Laterality: N/A;   ESOPHAGOGASTRODUODENOSCOPY (EGD) WITH PROPOFOL N/A 07/31/2020   Procedure: ESOPHAGOGASTRODUODENOSCOPY (EGD) WITH PROPOFOL;  Surgeon: Meridee Score Netty Starring., MD;  Location: [facility] ENDOSCOPY;  Service: Gastroenterology;  Laterality: N/A;   EYE SURGERY Bilateral 1999   lasik   POLYPECTOMY  07/31/2020   Procedure: POLYPECTOMY;  Surgeon: Mansouraty, Netty Starring., MD;  Location: [facility] ENDOSCOPY;  Service: Gastroenterology;;   TUBAL LIGATION     WISDOM TOOTH EXTRACTION      Prior to Admission medications   Medication Sig Start Date End Date Taking? Authorizing Provider  Ascorbic Acid (VITAMIN C) 1000 MG tablet Take 1,000 mg by mouth daily.   Yes [provider]  calcium carbonate (OS-CAL - DOSED IN MG OF ELEMENTAL CALCIUM) 1250 (500 Ca) MG tablet Take 1 tablet by mouth daily with breakfast.   Yes [provider]  famotidine (PEPCID) 20 MG tablet Take 1 tablet (20 mg total) by mouth 2 (two) times daily. 08/12/22  Yes Dale [place], MD  Multiple Vitamins-Minerals  (MULTIVITAMIN PO) Take 1 tablet by mouth daily.   Yes [provider]  Vitamin D, Cholecalciferol, 25 MCG (1000 UT) CAPS Take 1,000 Units by mouth daily.   Yes [provider]  zinc gluconate 50 MG tablet Take 50 mg by mouth daily.   Yes [provider]    Allergies as of 04/08/2023   (No Known Allergies)    Family History  Problem Relation Age of Onset   Breast cancer Mother 56       died age 38   Parkinson's disease Father    Lymphoma Maternal Grandmother        non-hodgkin's   Ovarian cancer Neg Hx    Diabetes Neg Hx    Colon cancer Neg Hx    Heart disease Neg Hx     Social History   Socioeconomic History   Marital status: Divorced    Spouse name: Not on file   Number of children: 1   Years of education: Not on file   Highest education level: Some college, no degree  Occupational History   Not on file  Tobacco Use   Smoking status: Never   Smokeless tobacco: Never  Vaping Use   Vaping status: Never Used  Substance and Sexual Activity   Alcohol use: Not Currently    Comment: occasional   Drug use: No   Sexual activity: Yes    Birth control/protection: Surgical, Post-menopausal    Comment: Tubal Ligation  Other Topics Concern   Not on file  Social History Narrative  Not on file   Social Drivers of Health   Financial Resource Strain: Low Risk  (09/13/2022)   Overall Financial Resource Strain (CARDIA)    Difficulty of Paying Living Expenses: Not hard at all  Food Insecurity: No Food Insecurity (09/13/2022)   Hunger Vital Sign    Worried About Running Out of Food in the Last Year: Never true    Ran Out of Food in the Last Year: Never true  Transportation Needs: No Transportation Needs (09/13/2022)   PRAPARE - Administrator, Civil Service (Medical): No    Lack of Transportation (Non-Medical): No  Physical Activity: Inactive (09/13/2022)   Exercise Vital Sign    Days of Exercise per Week: 0 days    Minutes of Exercise  per Session: 0 min  Stress: No Stress Concern Present (09/13/2022)   [employer] of Occupational Health - Occupational Stress Questionnaire    Feeling of Stress : Not at all  Social Connections: Moderately Integrated (09/13/2022)   Social Connection and Isolation Panel [NHANES]    Frequency of Communication with Friends and Family: More than three times a week    Frequency of Social Gatherings with Friends and Family: More than three times a week    Attends Religious Services: More than 4 times per year    Active Member of [employer] or Organizations: Yes    Attends Engineer, structural: More than 4 times per year    Marital Status: Divorced  Recent Concern: Social Connections - Moderately Isolated (09/13/2022)   Social Connection and Isolation Panel [NHANES]    Frequency of Communication with Friends and Family: More than three times a week    Frequency of Social Gatherings with Friends and Family: More than three times a week    Attends Religious Services: Never    Database administrator or Organizations: Yes    Attends Engineer, structural: More than 4 times per year    Marital Status: Divorced  Intimate Partner Violence: Not At Risk (09/13/2022)   Humiliation, Afraid, Rape, and Kick questionnaire    Fear of Current or Ex-Partner: No    Emotionally Abused: No    Physically Abused: No    Sexually Abused: No    Review of Systems: See HPI, otherwise negative ROS  Physical Exam: BP 117/74   Pulse 84   Temp 97.6 F (36.4 C) (Temporal)   Resp 16   Ht 5\' 1"  (1.549 m)   Wt 60.8 kg   LMP  (LMP Unknown)   SpO2 98%   BMI 25.32 kg/m  General:   Alert, cooperative in NAD Head:  Normocephalic and atraumatic. Respiratory:  Normal work of breathing. Cardiovascular:  RRR  Impression/Plan: Autumn Wolfe is here for cataract surgery.  Risks, benefits, limitations, and alternatives regarding cataract surgery have been reviewed with the patient.  Questions have  been answered.  All parties agreeable.   Galen Manila, MD  04/26/2023, 8:45 AM

## 2023-04-26 NOTE — Anesthesia Preprocedure Evaluation (Addendum)
 Anesthesia Evaluation  Patient identified by MRN, date of birth, ID band Patient awake    Reviewed: Allergy & Precautions, H&P , NPO status , Patient's Chart, lab work & pertinent test results  Airway Mallampati: III  TM Distance: >3 FB Neck ROM: Full    Dental no notable dental hx.    Pulmonary neg pulmonary ROS   Pulmonary exam normal breath sounds clear to auscultation       Cardiovascular negative cardio ROS Normal cardiovascular exam Rhythm:Regular Rate:Normal     Neuro/Psych negative neurological ROS  negative psych ROS   GI/Hepatic negative GI ROS, Neg liver ROS,GERD  ,,  Endo/Other  negative endocrine ROS    Renal/GU negative Renal ROS  negative genitourinary   Musculoskeletal negative musculoskeletal ROS (+)    Abdominal   Peds negative pediatric ROS (+)  Hematology negative hematology ROS (+)   Anesthesia Other Findings Osteoporosis  Vitamin D deficiency HPV (human papilloma virus) infection GERD (gastroesophageal reflux disease) Thrombocytosis     Reproductive/Obstetrics negative OB ROS                             Anesthesia Physical Anesthesia Plan  ASA: 2  Anesthesia Plan: MAC   Post-op Pain Management:    Induction: Intravenous  PONV Risk Score and Plan:   Airway Management Planned: Natural Airway and Nasal Cannula  Additional Equipment:   Intra-op Plan:   Post-operative Plan:   Informed Consent: I have reviewed the patients History and Physical, chart, labs and discussed the procedure including the risks, benefits and alternatives for the proposed anesthesia with the patient or authorized representative who has indicated his/her understanding and acceptance.     Dental Advisory Given  Plan Discussed with: Anesthesiologist, CRNA and Surgeon  Anesthesia Plan Comments: (Patient consented for risks of anesthesia including but not limited to:  - adverse  reactions to medications - damage to eyes, teeth, lips or other oral mucosa - nerve damage due to positioning  - sore throat or hoarseness - Damage to heart, brain, nerves, lungs, other parts of body or loss of life  Patient voiced understanding and assent.)       Anesthesia Quick Evaluation

## 2023-04-26 NOTE — Op Note (Signed)
 PREOPERATIVE DIAGNOSIS:  Nuclear sclerotic cataract of the right eye.   POSTOPERATIVE DIAGNOSIS:  Cataract   OPERATIVE PROCEDURE:ORPROCALL@   SURGEON:  Galen Manila, MD.   ANESTHESIA:  Anesthesiologist: Marisue Humble, MD CRNA: Barbette Hair, CRNA  1.      Managed anesthesia care. 2.      0.63ml of Shugarcaine was instilled in the eye following the paracentesis.   COMPLICATIONS:  None.   TECHNIQUE:   Stop and chop   DESCRIPTION OF PROCEDURE:  The patient was examined and consented in the preoperative holding area where the aforementioned topical anesthesia was applied to the right eye and then brought back to the Operating Room where the right eye was prepped and draped in the usual sterile ophthalmic fashion and a lid speculum was placed. A paracentesis was created with the side port blade and the anterior chamber was filled with viscoelastic. A near clear corneal incision was performed with the steel keratome. A continuous curvilinear capsulorrhexis was performed with a cystotome followed by the capsulorrhexis forceps. Hydrodissection and hydrodelineation were carried out with BSS on a blunt cannula. The lens was removed in a stop and chop  technique and the remaining cortical material was removed with the irrigation-aspiration handpiece. The capsular bag was inflated with viscoelastic and the Technis [id number]  lens was placed in the capsular bag without complication. The remaining viscoelastic was removed from the eye with the irrigation-aspiration handpiece. The wounds were hydrated. The anterior chamber was flushed with BSS and the eye was inflated to physiologic pressure. 0.81ml of Vigamox was placed in the anterior chamber. The wounds were found to be water tight. The eye was dressed with Combigan. The patient was given protective glasses to wear throughout the day and a shield with which to sleep tonight. The patient was also given drops with which to begin a drop regimen today and will  follow-up with me in one day. Implant Name Type Inv. Item Serial No. Manufacturer Lot No. LRB No. Used Action  LENS IOL TECNIS EYHANCE 21.0 - [id number] Intraocular Lens LENS IOL TECNIS EYHANCE 21.0 [id number] [employer]  Right 1 Implanted   Procedure(s): CATARACT EXTRACTION PHACO AND INTRAOCULAR LENS PLACEMENT (IOC) RIGHT 7.85 00:46.7 (Right)  Electronically signed: Galen Manila 04/26/2023 9:05 AM

## 2023-04-26 NOTE — Anesthesia Postprocedure Evaluation (Signed)
 Anesthesia Post Note  Patient: Autumn Wolfe  Procedure(s) Performed: CATARACT EXTRACTION PHACO AND INTRAOCULAR LENS PLACEMENT (IOC) RIGHT 7.85 00:46.7 (Right: Eye)  Patient location during evaluation: PACU Anesthesia Type: MAC Level of consciousness: awake and alert Pain management: pain level controlled Vital Signs Assessment: post-procedure vital signs reviewed and stable Respiratory status: spontaneous breathing, nonlabored ventilation, respiratory function stable and patient connected to nasal cannula oxygen Cardiovascular status: stable and blood pressure returned to baseline Postop Assessment: no apparent nausea or vomiting Anesthetic complications: no   No notable events documented.   Last Vitals:  Vitals:   04/26/23 0906 04/26/23 0913  BP: 100/67 106/70  Pulse: 84 80  Resp: 14 14  Temp: 36.5 C 36.5 C  SpO2: 98% 99%    Last Pain:  Vitals:   04/26/23 0913  TempSrc:   PainSc: 0-No pain                 Arletta Lumadue C Jerard Bays

## 2023-04-28 ENCOUNTER — Encounter: Payer: Self-pay | Admitting: Ophthalmology

## 2023-05-09 ENCOUNTER — Encounter: Payer: Self-pay | Admitting: Ophthalmology

## 2023-05-09 ENCOUNTER — Ambulatory Visit: Payer: Medicare Other | Admitting: Internal Medicine

## 2023-05-10 NOTE — Anesthesia Preprocedure Evaluation (Addendum)
 Anesthesia Evaluation  Patient identified by MRN, date of birth, ID band Patient awake    Reviewed: Allergy & Precautions, H&P , NPO status , Patient's Chart, lab work & pertinent test results  Airway Mallampati: III  TM Distance: >3 FB Neck ROM: Full    Dental no notable dental hx.    Pulmonary neg pulmonary ROS   Pulmonary exam normal breath sounds clear to auscultation       Cardiovascular negative cardio ROS Normal cardiovascular exam Rhythm:Regular Rate:Normal     Neuro/Psych negative neurological ROS  negative psych ROS   GI/Hepatic negative GI ROS, Neg liver ROS,GERD  ,,  Endo/Other  negative endocrine ROS    Renal/GU negative Renal ROS  negative genitourinary   Musculoskeletal negative musculoskeletal ROS (+)    Abdominal   Peds negative pediatric ROS (+)  Hematology negative hematology ROS (+)   Anesthesia Other Findings Previous cataract surgery 04-26-23 Dr. Juel Burrow anesthesiologist    Osteoporosis  Vitamin D deficiency HPV (human papilloma virus) infection GERD (gastroesophageal reflux disease) Thrombocytosis      Reproductive/Obstetrics negative OB ROS                             Anesthesia Physical Anesthesia Plan  ASA: 3  Anesthesia Plan: MAC   Post-op Pain Management:    Induction: Intravenous  PONV Risk Score and Plan:   Airway Management Planned: Natural Airway and Nasal Cannula  Additional Equipment:   Intra-op Plan:   Post-operative Plan:   Informed Consent: I have reviewed the patients History and Physical, chart, labs and discussed the procedure including the risks, benefits and alternatives for the proposed anesthesia with the patient or authorized representative who has indicated his/her understanding and acceptance.     Dental Advisory Given  Plan Discussed with: Anesthesiologist, CRNA and Surgeon  Anesthesia Plan Comments: (Patient  consented for risks of anesthesia including but not limited to:  - adverse reactions to medications - damage to eyes, teeth, lips or other oral mucosa - nerve damage due to positioning  - sore throat or hoarseness - Damage to heart, brain, nerves, lungs, other parts of body or loss of life  Patient voiced understanding and assent.)        Anesthesia Quick Evaluation

## 2023-05-11 NOTE — Discharge Instructions (Signed)

## 2023-05-17 ENCOUNTER — Ambulatory Visit: Payer: Self-pay | Admitting: Anesthesiology

## 2023-05-17 ENCOUNTER — Ambulatory Visit
Admission: RE | Admit: 2023-05-17 | Discharge: 2023-05-17 | Disposition: A | Discharge status: Home / Self Care | Payer: Medicare Other | Attending: Ophthalmology | Admitting: Ophthalmology

## 2023-05-17 ENCOUNTER — Encounter
Admission: RE | Disposition: A | Discharge status: Home / Self Care | Payer: Self-pay | Source: Home / Self Care | Attending: Ophthalmology

## 2023-05-17 ENCOUNTER — Encounter: Payer: Self-pay | Admitting: Ophthalmology

## 2023-05-17 ENCOUNTER — Other Ambulatory Visit: Payer: Self-pay

## 2023-05-17 DIAGNOSIS — H2512 Age-related nuclear cataract, left eye: Secondary | ICD-10-CM | POA: Insufficient documentation

## 2023-05-17 DIAGNOSIS — K219 Gastro-esophageal reflux disease without esophagitis: Secondary | ICD-10-CM | POA: Insufficient documentation

## 2023-05-17 HISTORY — PX: CATARACT EXTRACTION W/PHACO: SHX586

## 2023-05-17 SURGERY — PHACOEMULSIFICATION, CATARACT, WITH IOL INSERTION
Anesthesia: Monitor Anesthesia Care | Laterality: Left

## 2023-05-17 MED ORDER — MOXIFLOXACIN HCL 0.5 % OP SOLN
OPHTHALMIC | Status: DC | PRN
  Administered 2023-05-17: .2 mL via OPHTHALMIC

## 2023-05-17 MED ORDER — SIGHTPATH DOSE#1 BSS IO SOLN
INTRAOCULAR | Status: DC | PRN
  Administered 2023-05-17: 39 mL via OPHTHALMIC

## 2023-05-17 MED ORDER — BRIMONIDINE TARTRATE-TIMOLOL 0.2-0.5 % OP SOLN
OPHTHALMIC | Status: DC | PRN
  Administered 2023-05-17: 1 [drp] via OPHTHALMIC

## 2023-05-17 MED ORDER — MIDAZOLAM HCL 2 MG/2ML IJ SOLN
INTRAMUSCULAR | Status: DC | PRN
  Administered 2023-05-17: 2 mg via INTRAVENOUS

## 2023-05-17 MED ORDER — SIGHTPATH DOSE#1 NA CHONDROIT SULF-NA HYALURON 40-17 MG/ML IO SOLN
INTRAOCULAR | Status: DC | PRN
  Administered 2023-05-17: 1 mL via INTRAOCULAR

## 2023-05-17 MED ORDER — FENTANYL CITRATE (PF) 100 MCG/2ML IJ SOLN
INTRAMUSCULAR | Status: AC
  Filled 2023-05-17: qty 2

## 2023-05-17 MED ORDER — TETRACAINE HCL 0.5 % OP SOLN
1.0000 [drp] | OPHTHALMIC | Status: DC | PRN
  Administered 2023-05-17 (×3): 1 [drp] via OPHTHALMIC

## 2023-05-17 MED ORDER — SIGHTPATH DOSE#1 BSS IO SOLN
INTRAOCULAR | Status: DC | PRN
  Administered 2023-05-17: 15 mL via INTRAOCULAR

## 2023-05-17 MED ORDER — ARMC OPHTHALMIC DILATING DROPS
1.0000 | OPHTHALMIC | Status: DC | PRN
  Administered 2023-05-17 (×3): 1 via OPHTHALMIC

## 2023-05-17 MED ORDER — MIDAZOLAM HCL 2 MG/2ML IJ SOLN
INTRAMUSCULAR | Status: AC
Start: 2023-05-17 — End: ?
  Filled 2023-05-17: qty 2

## 2023-05-17 MED ORDER — LIDOCAINE HCL (PF) 2 % IJ SOLN
INTRAOCULAR | Status: DC | PRN
  Administered 2023-05-17: 1 mL

## 2023-05-17 MED ORDER — FENTANYL CITRATE (PF) 100 MCG/2ML IJ SOLN
INTRAMUSCULAR | Status: DC | PRN
  Administered 2023-05-17: 50 ug via INTRAVENOUS

## 2023-05-17 SURGICAL SUPPLY — 12 items
CATARACT SUITE SIGHTPATH (MISCELLANEOUS) ×1 IMPLANT
CYSTOTOME ANG REV CUT SHRT 25G (CUTTER) ×1 IMPLANT
CYSTOTOME ANGL RVRS SHRT 25G (CUTTER) ×1 IMPLANT
FEE CATARACT SUITE SIGHTPATH (MISCELLANEOUS) ×1 IMPLANT
GLOVE BIOGEL PI IND STRL 8 (GLOVE) ×1 IMPLANT
GLOVE SURG LX STRL 8.0 MICRO (GLOVE) ×1 IMPLANT
GLOVE SURG PROTEXIS BL SZ6.5 (GLOVE) ×1 IMPLANT
GLOVE SURG SYN 6.5 PF PI BL (GLOVE) ×1 IMPLANT
LENS IOL TECNIS EYHANCE 21.0 (Intraocular Lens) IMPLANT
NDL FILTER BLUNT 18X1 1/2 (NEEDLE) ×1 IMPLANT
NEEDLE FILTER BLUNT 18X1 1/2 (NEEDLE) ×1 IMPLANT
SYR 3ML LL SCALE MARK (SYRINGE) ×1 IMPLANT

## 2023-05-17 NOTE — Anesthesia Postprocedure Evaluation (Signed)
 Anesthesia Post Note  Patient: Autumn Wolfe  Procedure(s) Performed: CATARACT EXTRACTION PHACO AND INTRAOCULAR LENS PLACEMENT (IOC) LEFT 8.29, 00:44.3 (Left)  Patient location during evaluation: PACU Anesthesia Type: MAC Level of consciousness: awake and alert Pain management: pain level controlled Vital Signs Assessment: post-procedure vital signs reviewed and stable Respiratory status: spontaneous breathing, nonlabored ventilation, respiratory function stable and patient connected to nasal cannula oxygen Cardiovascular status: stable and blood pressure returned to baseline Postop Assessment: no apparent nausea or vomiting Anesthetic complications: no   No notable events documented.   Last Vitals:  Vitals:   05/17/23 0837 05/17/23 0840  BP: 96/64 107/62  Pulse: 77 78  Resp: 11 12  Temp: 36.8 C   SpO2: 98% 95%    Last Pain:  Vitals:   05/17/23 0840  TempSrc:   PainSc: 0-No pain                 Akina Maish C Jaeleigh Monaco

## 2023-05-17 NOTE — Transfer of Care (Signed)
 Immediate Anesthesia Transfer of Care Note  Patient: Autumn Wolfe  Procedure(s) Performed: CATARACT EXTRACTION PHACO AND INTRAOCULAR LENS PLACEMENT (IOC) LEFT 8.29, 00:44.3 (Left)  Patient Location: PACU  Anesthesia Type: MAC  Level of Consciousness: awake, alert  and patient cooperative  Airway and Oxygen Therapy: Patient Spontanous Breathing and Patient connected to supplemental oxygen  Post-op Assessment: Post-op Vital signs reviewed, Patient's Cardiovascular Status Stable, Respiratory Function Stable, Patent Airway and No signs of Nausea or vomiting  Post-op Vital Signs: Reviewed and stable  Complications: No notable events documented.

## 2023-05-17 NOTE — Op Note (Signed)
 PREOPERATIVE DIAGNOSIS:  Nuclear sclerotic cataract of the left eye.   POSTOPERATIVE DIAGNOSIS:  Nuclear sclerotic cataract of the left eye.   OPERATIVE PROCEDURE:ORPROCALL@   SURGEON:  Galen Manila, MD.   ANESTHESIA:  Anesthesiologist: Marisue Humble, MD CRNA: Barbette Hair, CRNA  1.      Managed anesthesia care. 2.     0.48ml of Shugarcaine was instilled following the paracentesis   COMPLICATIONS:  None.   TECHNIQUE:   Stop and chop   DESCRIPTION OF PROCEDURE:  The patient was examined and consented in the preoperative holding area where the aforementioned topical anesthesia was applied to the left eye and then brought back to the Operating Room where the left eye was prepped and draped in the usual sterile ophthalmic fashion and a lid speculum was placed. A paracentesis was created with the side port blade and the anterior chamber was filled with viscoelastic. A near clear corneal incision was performed with the steel keratome. A continuous curvilinear capsulorrhexis was performed with a cystotome followed by the capsulorrhexis forceps. Hydrodissection and hydrodelineation were carried out with BSS on a blunt cannula. The lens was removed in a stop and chop  technique and the remaining cortical material was removed with the irrigation-aspiration handpiece. The capsular bag was inflated with viscoelastic and the Technis [id number] lens was placed in the capsular bag without complication. The remaining viscoelastic was removed from the eye with the irrigation-aspiration handpiece. The wounds were hydrated. The anterior chamber was flushed with BSS and the eye was inflated to physiologic pressure. 0.40ml Vigamox was placed in the anterior chamber. The wounds were found to be water tight. The eye was dressed with Combigan. The patient was given protective glasses to wear throughout the day and a shield with which to sleep tonight. The patient was also given drops with which to begin a drop regimen  today and will follow-up with me in one day. Implant Name Type Inv. Item Serial No. Manufacturer Lot No. LRB No. Used Action  LENS IOL TECNIS EYHANCE 21.0 - [id number] Intraocular Lens LENS IOL TECNIS EYHANCE 21.0 [id number] [employer]  Left 1 Implanted    Procedure(s): CATARACT EXTRACTION PHACO AND INTRAOCULAR LENS PLACEMENT (IOC) LEFT 8.29, 00:44.3 (Left)  Electronically signed: Galen Manila 05/17/2023 8:35 AM

## 2023-05-17 NOTE — H&P (Signed)
  [facility]   Primary Care Physician:  Dale Millhousen, MD Ophthalmologist: Dr. Druscilla Brownie  Pre-Procedure History & Physical: HPI:  Autumn Wolfe is a 68 y.o. female here for cataract surgery.   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   Osteoporosis    Thrombocytosis    Vitamin D deficiency     Past Surgical History:  Procedure Laterality Date   BIOPSY  07/31/2020   Procedure: BIOPSY;  Surgeon: Mansouraty, Netty Starring., MD;  Location: [facility] ENDOSCOPY;  Service: Gastroenterology;;   BREAST EXCISIONAL BIOPSY Left 1989   benign   BREAST SURGERY  1989   left breast biopsy   CATARACT EXTRACTION W/PHACO Right 04/26/2023   Procedure: CATARACT EXTRACTION PHACO AND INTRAOCULAR LENS PLACEMENT (IOC) RIGHT 7.85 00:46.7;  Surgeon: Galen Manila, MD;  Location: [facility];  Service: Ophthalmology;  Laterality: Right;   COLONOSCOPY     x 2   ESOPHAGOGASTRODUODENOSCOPY (EGD) WITH PROPOFOL N/A 05/30/2020   Procedure: ESOPHAGOGASTRODUODENOSCOPY (EGD) WITH PROPOFOL;  Surgeon: Pasty Spillers, MD;  Location: [facility] ENDOSCOPY;  Service: Endoscopy;  Laterality: N/A;   ESOPHAGOGASTRODUODENOSCOPY (EGD) WITH PROPOFOL N/A 07/31/2020   Procedure: ESOPHAGOGASTRODUODENOSCOPY (EGD) WITH PROPOFOL;  Surgeon: Meridee Score Netty Starring., MD;  Location: [facility] ENDOSCOPY;  Service: Gastroenterology;  Laterality: N/A;   EYE SURGERY Bilateral 1999   lasik   POLYPECTOMY  07/31/2020   Procedure: POLYPECTOMY;  Surgeon: Mansouraty, Netty Starring., MD;  Location: [facility] ENDOSCOPY;  Service: Gastroenterology;;   TUBAL LIGATION     WISDOM TOOTH EXTRACTION      Prior to Admission medications   Medication Sig Start Date End Date Taking? Authorizing Provider  Ascorbic Acid (VITAMIN C) 1000 MG tablet Take 1,000 mg by mouth daily.   Yes [provider]  calcium carbonate (OS-CAL - DOSED IN MG OF ELEMENTAL CALCIUM) 1250 (500 Ca)  MG tablet Take 1 tablet by mouth daily with breakfast.   Yes [provider]  famotidine (PEPCID) 20 MG tablet Take 1 tablet (20 mg total) by mouth 2 (two) times daily. 08/12/22  Yes Dale [place], MD  Multiple Vitamins-Minerals (MULTIVITAMIN PO) Take 1 tablet by mouth daily.   Yes [provider]  Vitamin D, Cholecalciferol, 25 MCG (1000 UT) CAPS Take 1,000 Units by mouth daily.   Yes [provider]  zinc gluconate 50 MG tablet Take 50 mg by mouth daily.   Yes [provider]    Allergies as of 04/08/2023   (No Known Allergies)    Family History  Problem Relation Age of Onset   Breast cancer Mother 71       died age 52   Parkinson's disease Father    Lymphoma Maternal Grandmother        non-hodgkin's   Ovarian cancer Neg Hx    Diabetes Neg Hx    Colon cancer Neg Hx    Heart disease Neg Hx     Social History   Socioeconomic History   Marital status: Divorced    Spouse name: Not on file   Number of children: 1   Years of education: Not on file   Highest education level: Some college, no degree  Occupational History   Not on file  Tobacco Use   Smoking status: Never   Smokeless tobacco: Never  Vaping Use   Vaping status: Never Used  Substance and Sexual Activity   Alcohol use: Not Currently    Comment: occasional  Drug use: No   Sexual activity: Yes    Birth control/protection: Surgical, Post-menopausal    Comment: Tubal Ligation  Other Topics Concern   Not on file  Social History Narrative   Not on file   Social Drivers of Health   Financial Resource Strain: Low Risk  (09/13/2022)   Overall Financial Resource Strain (CARDIA)    Difficulty of Paying Living Expenses: Not hard at all  Food Insecurity: No Food Insecurity (09/13/2022)   Hunger Vital Sign    Worried About Running Out of Food in the Last Year: Never true    Ran Out of Food in the Last Year: Never true  Transportation Needs: No Transportation Needs (09/13/2022)    PRAPARE - Administrator, Civil Service (Medical): No    Lack of Transportation (Non-Medical): No  Physical Activity: Inactive (09/13/2022)   Exercise Vital Sign    Days of Exercise per Week: 0 days    Minutes of Exercise per Session: 0 min  Stress: No Stress Concern Present (09/13/2022)   [employer] of Occupational Health - Occupational Stress Questionnaire    Feeling of Stress : Not at all  Social Connections: Moderately Integrated (09/13/2022)   Social Connection and Isolation Panel [NHANES]    Frequency of Communication with Friends and Family: More than three times a week    Frequency of Social Gatherings with Friends and Family: More than three times a week    Attends Religious Services: More than 4 times per year    Active Member of [employer] or Organizations: Yes    Attends Engineer, structural: More than 4 times per year    Marital Status: Divorced  Recent Concern: Social Connections - Moderately Isolated (09/13/2022)   Social Connection and Isolation Panel [NHANES]    Frequency of Communication with Friends and Family: More than three times a week    Frequency of Social Gatherings with Friends and Family: More than three times a week    Attends Religious Services: Never    Database administrator or Organizations: Yes    Attends Engineer, structural: More than 4 times per year    Marital Status: Divorced  Intimate Partner Violence: Not At Risk (09/13/2022)   Humiliation, Afraid, Rape, and Kick questionnaire    Fear of Current or Ex-Partner: No    Emotionally Abused: No    Physically Abused: No    Sexually Abused: No    Review of Systems: See HPI, otherwise negative ROS  Physical Exam: BP 113/83   Temp (!) 97.3 F (36.3 C) (Temporal)   Resp (!) 21   Ht 5' 0.98" (1.549 m)   Wt 60.8 kg   LMP  (LMP Unknown)   SpO2 98%   BMI 25.33 kg/m  General:   Alert, cooperative in NAD Head:  Normocephalic and atraumatic. Respiratory:  Normal work  of breathing. Cardiovascular:  RRR  Impression/Plan: Autumn Wolfe is here for cataract surgery.  Risks, benefits, limitations, and alternatives regarding cataract surgery have been reviewed with the patient.  Questions have been answered.  All parties agreeable.   Galen Manila, MD  05/17/2023, 8:15 AM

## 2023-05-19 ENCOUNTER — Encounter: Payer: Self-pay | Admitting: Ophthalmology

## 2023-07-30 ENCOUNTER — Other Ambulatory Visit: Payer: Self-pay | Admitting: Internal Medicine

## 2023-10-30 ENCOUNTER — Other Ambulatory Visit: Payer: Self-pay | Admitting: Internal Medicine

## 2023-11-15 ENCOUNTER — Ambulatory Visit: Payer: Medicare Other | Admitting: Internal Medicine

## 2023-11-15 VITALS — BP 118/68 | HR 88 | Resp 16 | Ht 61.0 in | Wt 139.8 lb

## 2023-11-15 DIAGNOSIS — M810 Age-related osteoporosis without current pathological fracture: Secondary | ICD-10-CM

## 2023-11-15 DIAGNOSIS — R5383 Other fatigue: Secondary | ICD-10-CM

## 2023-11-15 DIAGNOSIS — M25551 Pain in right hip: Secondary | ICD-10-CM

## 2023-11-15 DIAGNOSIS — E559 Vitamin D deficiency, unspecified: Secondary | ICD-10-CM | POA: Diagnosis not present

## 2023-11-15 DIAGNOSIS — K219 Gastro-esophageal reflux disease without esophagitis: Secondary | ICD-10-CM

## 2023-11-15 DIAGNOSIS — D75839 Thrombocytosis, unspecified: Secondary | ICD-10-CM

## 2023-11-15 DIAGNOSIS — F439 Reaction to severe stress, unspecified: Secondary | ICD-10-CM

## 2023-11-15 DIAGNOSIS — Z1231 Encounter for screening mammogram for malignant neoplasm of breast: Secondary | ICD-10-CM

## 2023-11-15 DIAGNOSIS — Z13220 Encounter for screening for lipoid disorders: Secondary | ICD-10-CM

## 2023-11-15 DIAGNOSIS — Z0000 Encounter for general adult medical examination without abnormal findings: Secondary | ICD-10-CM | POA: Diagnosis not present

## 2023-11-15 LAB — CBC WITH DIFFERENTIAL/PLATELET
Basophils Absolute: 0.1 K/uL (ref 0.0–0.1)
Basophils Relative: 0.9 % (ref 0.0–3.0)
Eosinophils Absolute: 0.1 K/uL (ref 0.0–0.7)
Eosinophils Relative: 1.4 % (ref 0.0–5.0)
HCT: 41.1 % (ref 36.0–46.0)
Hemoglobin: 13.4 g/dL (ref 12.0–15.0)
Lymphocytes Relative: 32.9 % (ref 12.0–46.0)
Lymphs Abs: 2 K/uL (ref 0.7–4.0)
MCHC: 32.7 g/dL (ref 30.0–36.0)
MCV: 90.7 fl (ref 78.0–100.0)
Monocytes Absolute: 0.6 K/uL (ref 0.1–1.0)
Monocytes Relative: 9.7 % (ref 3.0–12.0)
Neutro Abs: 3.4 K/uL (ref 1.4–7.7)
Neutrophils Relative %: 55.1 % (ref 43.0–77.0)
Platelets: 344 K/uL (ref 150.0–400.0)
RBC: 4.53 Mil/uL (ref 3.87–5.11)
RDW: 13.9 % (ref 11.5–15.5)
WBC: 6.1 K/uL (ref 4.0–10.5)

## 2023-11-15 LAB — LIPID PANEL
Cholesterol: 189 mg/dL (ref 0–200)
HDL: 77.8 mg/dL (ref >39.00)
LDL Cholesterol: 89 mg/dL (ref 0–99)
NonHDL: 111.24
Total CHOL/HDL Ratio: 2
Triglycerides: 110 mg/dL (ref 0.0–149.0)
VLDL: 22 mg/dL (ref 0.0–40.0)

## 2023-11-15 LAB — VITAMIN D 25 HYDROXY (VIT D DEFICIENCY, FRACTURES): VITD: 55.97 ng/mL (ref 30.00–100.00)

## 2023-11-15 LAB — TSH: TSH: 1.54 u[IU]/mL (ref 0.35–5.50)

## 2023-11-15 NOTE — Assessment & Plan Note (Signed)
 Physical today 11/15/23.  PAP 11/06/20 - negative with negative HPV. Mammogram 12/07/22 - birads I.  Schedule f/u mammogram. Colonoscopy 08/17/21 (Dr Aundria) - normal.  Per 07/2021 note recommended f/u in 10 year

## 2023-11-15 NOTE — Progress Notes (Signed)
 Subjective:    [name], female    DOB: 22-Apr-1955, 68 y.o.   MRN: [id number]  Patient here for  Chief Complaint  Patient presents with   Annual Exam    HPI With past history of GERD and osteoporosis, comes in today to follow up on these issues as well as for a complete physical exam. S/p recent cataract surgery. Reports increased stress - family stress. Discussed. Overall she feels she is handling things relatively well. Tries to stay active. No chest pain or sob reported. Has had some pain - leg/hip - right hip. Discussed. Chiropractor. Previously saw ortho. Getting back to gym.    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   Osteoporosis    Thrombocytosis    Vitamin D  deficiency    Past Surgical History:  Procedure Laterality Date   BIOPSY  07/31/2020   Procedure: BIOPSY;  Surgeon: Mansouraty, Aloha Raddle., MD;  Location: [facility] ENDOSCOPY;  Service: Gastroenterology;;   BREAST EXCISIONAL BIOPSY Left 1989   benign   BREAST SURGERY  1989   left breast biopsy   CATARACT EXTRACTION W/PHACO Right 04/26/2023   Procedure: CATARACT EXTRACTION PHACO AND INTRAOCULAR LENS PLACEMENT (IOC) RIGHT 7.85 00:46.7;  Surgeon: Jaye Fallow, MD;  Location: [facility] SURGERY [facility];  Service: Ophthalmology;  Laterality: Right;   CATARACT EXTRACTION W/PHACO Left 05/17/2023   Procedure: CATARACT EXTRACTION PHACO AND INTRAOCULAR LENS PLACEMENT (IOC) LEFT 8.29, 00:44.3;  Surgeon: Jaye Fallow, MD;  Location: [facility];  Service: Ophthalmology;  Laterality: Left;   COLONOSCOPY     x 2   ESOPHAGOGASTRODUODENOSCOPY (EGD) WITH PROPOFOL  N/A 05/30/2020   Procedure: ESOPHAGOGASTRODUODENOSCOPY (EGD) WITH PROPOFOL ;  Surgeon: Janalyn Keene NOVAK, MD;  Location: [facility] ENDOSCOPY;  Service: Endoscopy;  Laterality: N/A;   ESOPHAGOGASTRODUODENOSCOPY (EGD) WITH PROPOFOL  N/A 07/31/2020   Procedure:  ESOPHAGOGASTRODUODENOSCOPY (EGD) WITH PROPOFOL ;  Surgeon: Wilhelmenia Aloha Raddle., MD;  Location: [facility] ENDOSCOPY;  Service: Gastroenterology;  Laterality: N/A;   EYE SURGERY Bilateral 1999   lasik   POLYPECTOMY  07/31/2020   Procedure: POLYPECTOMY;  Surgeon: Mansouraty, Aloha Raddle., MD;  Location: [facility] ENDOSCOPY;  Service: Gastroenterology;;   TUBAL LIGATION     WISDOM TOOTH EXTRACTION     Family History  Problem Relation Age of Onset   Breast cancer Mother 60       died age 4   Parkinson's disease Father    Lymphoma Maternal Grandmother        non-hodgkin's   Ovarian cancer Neg Hx    Diabetes Neg Hx    Colon cancer Neg Hx    Heart disease Neg Hx    Social History   Socioeconomic History   Marital status: Divorced    Spouse name: Not on file   Number of children: 1   Years of education: Not on file   Highest education level: Some college, no degree  Occupational History   Not on file  Tobacco Use   Smoking status: Never   Smokeless tobacco: Never  Vaping Use   Vaping status: Never Used  Substance and Sexual Activity   Alcohol use: Not Currently    Comment: occasional   Drug use: No   Sexual activity: Yes    Birth control/protection: Surgical, Post-menopausal    Comment: Tubal Ligation  Other Topics Concern   Not on file  Social History Narrative   Not on file   Social Drivers of  Health   Financial Resource Strain: Low Risk  (09/13/2022)   Overall Financial Resource Strain (CARDIA)    Difficulty of Paying Living Expenses: Not hard at all  Food Insecurity: No Food Insecurity (09/13/2022)   Hunger Vital Sign    Worried About Running Out of Food in the Last Year: Never true    Ran Out of Food in the Last Year: Never true  Transportation Needs: No Transportation Needs (09/13/2022)   PRAPARE - Administrator, Civil Service (Medical): No    Lack of Transportation (Non-Medical): No  Physical Activity: Inactive (09/13/2022)   Exercise Vital Sign    Days of  Exercise per Week: 0 days    Minutes of Exercise per Session: 0 min  Stress: No Stress Concern Present (09/13/2022)   [employer] of Occupational Health - Occupational Stress Questionnaire    Feeling of Stress : Not at all  Social Connections: Moderately Integrated (09/13/2022)   Social Connection and Isolation Panel    Frequency of Communication with Friends and Family: More than three times a week    Frequency of Social Gatherings with Friends and Family: More than three times a week    Attends Religious Services: More than 4 times per year    Active Member of [employer] or Organizations: Yes    Attends Engineer, structural: More than 4 times per year    Marital Status: Divorced  Recent Concern: Social Connections - Moderately Isolated (09/13/2022)   Social Connection and Isolation Panel    Frequency of Communication with Friends and Family: More than three times a week    Frequency of Social Gatherings with Friends and Family: More than three times a week    Attends Religious Services: Never    Database administrator or Organizations: Yes    Attends Engineer, structural: More than 4 times per year    Marital Status: Divorced     Review of Systems  Constitutional:  Negative for appetite change and unexpected weight change.  HENT:  Negative for congestion, sinus pressure and sore throat.   Eyes:  Negative for pain and visual disturbance.  Respiratory:  Negative for cough, chest tightness and shortness of breath.   Cardiovascular:  Negative for chest pain, palpitations and leg swelling.  Gastrointestinal:  Negative for abdominal pain, diarrhea, nausea and vomiting.  Genitourinary:  Negative for difficulty urinating and dysuria.  Musculoskeletal:  Negative for joint swelling and myalgias.       Right hip pain as outined.   Skin:  Negative for color change and rash.  Neurological:  Negative for dizziness and headaches.  Hematological:  Negative for adenopathy. Does  not bruise/bleed easily.  Psychiatric/Behavioral:  Negative for decreased concentration and dysphoric mood.        Objective:     BP 118/68   Pulse 88   Resp 16   Ht 5' 1 (1.549 m)   Wt 139 lb 12.8 oz (63.4 kg)   LMP  (LMP Unknown)   SpO2 99%   BMI 26.41 kg/m  Wt Readings from Last 3 Encounters:  11/15/23 139 lb 12.8 oz (63.4 kg)  05/17/23 134 lb (60.8 kg)  04/26/23 134 lb (60.8 kg)    Physical Exam Vitals reviewed.  Constitutional:      General: She is not in acute distress.    Appearance: Normal appearance. She is well-developed.  HENT:     Head: Normocephalic and atraumatic.     Right Ear: External ear  normal.     Left Ear: External ear normal.     Mouth/Throat:     Pharynx: No oropharyngeal exudate or posterior oropharyngeal erythema.  Eyes:     General: No scleral icterus.       Right eye: No discharge.        Left eye: No discharge.     Conjunctiva/sclera: Conjunctivae normal.  Neck:     Thyroid : No thyromegaly.  Cardiovascular:     Rate and Rhythm: Normal rate and regular rhythm.  Pulmonary:     Effort: No tachypnea, accessory muscle usage or respiratory distress.     Breath sounds: Normal breath sounds. No decreased breath sounds or wheezing.  Chest:  Breasts:    Right: No inverted nipple, mass, nipple discharge or tenderness (no axillary adenopathy).     Left: No inverted nipple, mass, nipple discharge or tenderness (no axilarry adenopathy).  Abdominal:     General: Bowel sounds are normal.     Palpations: Abdomen is soft.     Tenderness: There is no abdominal tenderness.  Musculoskeletal:        General: No swelling or tenderness.     Cervical back: Neck supple.  Lymphadenopathy:     Cervical: No cervical adenopathy.  Skin:    Findings: No erythema or rash.  Neurological:     Mental Status: She is alert and oriented to person, place, and time.  Psychiatric:        Mood and Affect: Mood normal.        Behavior: Behavior normal.          Outpatient Encounter Medications as of 11/15/2023  Medication Sig   famotidine  (PEPCID ) 20 MG tablet Take 1 tablet by mouth twice daily (Patient taking differently: Take 20 mg by mouth daily.)   Ascorbic Acid (VITAMIN C) 1000 MG tablet Take 1,000 mg by mouth daily.   calcium carbonate (OS-CAL - DOSED IN MG OF ELEMENTAL CALCIUM) 1250 (500 Ca) MG tablet Take 1 tablet by mouth daily with breakfast.   Multiple Vitamins-Minerals (MULTIVITAMIN PO) Take 1 tablet by mouth daily.   Vitamin D , Cholecalciferol, 25 MCG (1000 UT) CAPS Take 1,000 Units by mouth daily.   zinc gluconate 50 MG tablet Take 50 mg by mouth daily.   No facility-administered encounter medications on file as of 11/15/2023.     Lab Results  Component Value Date   WBC 6.1 11/15/2023   HGB 13.4 11/15/2023   HCT 41.1 11/15/2023   PLT 344.0 11/15/2023   GLUCOSE 97 11/15/2023   CHOL 189 11/15/2023   TRIG 110.0 11/15/2023   HDL 77.80 11/15/2023   LDLCALC 89 11/15/2023   ALT 13 11/15/2023   AST 18 11/15/2023   NA 138 11/15/2023   K 4.5 11/15/2023   CL 103 11/15/2023   CREATININE 0.73 11/15/2023   BUN 14 11/15/2023   CO2 28 11/15/2023   TSH 1.54 11/15/2023       Assessment & Plan:  Healthcare maintenance Assessment & Plan: Physical today 11/15/23.  PAP 11/06/20 - negative with negative HPV. Mammogram 12/07/22 - birads I.  Schedule f/u mammogram. Colonoscopy 08/17/21 (Dr Aundria) - normal.  Per 07/2021 note recommended f/u in 10 year   Vitamin D  deficiency Assessment & Plan: Check vitamin D  level.   Orders: -     VITAMIN D  25 Hydroxy (Vit-D Deficiency, Fractures)  Thrombocytosis Assessment & Plan: Follow cbc.   Orders: -     CBC with Differential/Platelet -     Comprehensive  metabolic panel with GFR  Osteoporosis without current pathological fracture, unspecified osteoporosis type -     Comprehensive metabolic panel with GFR  Encounter for screening mammogram for malignant neoplasm of breast -     3D Screening  Mammogram, Left and Right; Future  Screening cholesterol level -     Lipid panel  Other fatigue Assessment & Plan: Increased stress. Discussed. Check. cbc, met c and tsh.   Orders: -     TSH  Stress Assessment & Plan: Increased stress as outlined. Discussed. Will notify me if feels needs further intervention. Follow.    Right hip pain Assessment & Plan: Saw ortho.  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  Chiropractor. Getting back to the gym. Follow.    Gastroesophageal reflux disease, unspecified whether esophagitis present Assessment & Plan: Continue pepcid .  Follow. EGD 06/2020. Recommended f/u EGD in one year. EGD 07/2021 - normal EGD, gastritis, 2cm hiatal hernia. pathology squamous mucosa with no significant histopathologic changes. Recommended no f/u EGD       Allena Hamilton, MD

## 2023-11-18 ENCOUNTER — Other Ambulatory Visit: Payer: Self-pay | Admitting: *Deleted

## 2023-11-18 ENCOUNTER — Ambulatory Visit: Payer: Self-pay | Admitting: Internal Medicine

## 2023-11-18 DIAGNOSIS — E871 Hypo-osmolality and hyponatremia: Secondary | ICD-10-CM

## 2023-11-19 LAB — COMPREHENSIVE METABOLIC PANEL WITH GFR
ALT: 13 U/L (ref 0–35)
AST: 18 U/L (ref 0–37)
Albumin: 4.7 g/dL (ref 3.5–5.2)
Alkaline Phosphatase: 97 U/L (ref 39–117)
BUN: 14 mg/dL (ref 6–23)
CO2: 28 meq/L (ref 19–32)
Calcium: 10 mg/dL (ref 8.4–10.5)
Chloride: 103 meq/L (ref 96–112)
Creatinine, Ser: 0.73 mg/dL (ref 0.40–1.20)
GFR: 83.58 mL/min
Glucose, Bld: 97 mg/dL (ref 70–99)
Potassium: 4.5 meq/L (ref 3.5–5.1)
Sodium: 138 meq/L (ref 135–145)
Total Bilirubin: 0.5 mg/dL (ref 0.2–1.2)
Total Protein: 7.2 g/dL (ref 6.0–8.3)

## 2023-11-20 ENCOUNTER — Encounter: Payer: Self-pay | Admitting: Internal Medicine

## 2023-11-20 NOTE — Assessment & Plan Note (Signed)
 Continue pepcid .  Follow. EGD 06/2020. Recommended f/u EGD in one year. EGD 07/2021 - normal EGD, gastritis, 2cm hiatal hernia. pathology squamous mucosa with no significant histopathologic changes. Recommended no f/u EGD

## 2023-11-20 NOTE — Assessment & Plan Note (Signed)
 Follow cbc.

## 2023-11-20 NOTE — Assessment & Plan Note (Signed)
 Increased stress. Discussed. Check. cbc, met c and tsh.

## 2023-11-20 NOTE — Assessment & Plan Note (Signed)
 Increased stress as outlined. Discussed. Will notify me if feels needs further intervention. Follow.

## 2023-11-20 NOTE — Assessment & Plan Note (Signed)
 Saw ortho.  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  Chiropractor. Getting back to the gym. Follow.

## 2023-11-20 NOTE — Assessment & Plan Note (Signed)
 Check vitamin D  level

## 2023-12-02 ENCOUNTER — Other Ambulatory Visit

## 2023-12-13 ENCOUNTER — Ambulatory Visit
Admission: RE | Admit: 2023-12-13 | Discharge: 2023-12-13 | Disposition: A | Discharge status: Home / Self Care | Source: Ambulatory Visit | Attending: Internal Medicine | Admitting: Internal Medicine

## 2023-12-13 DIAGNOSIS — Z1231 Encounter for screening mammogram for malignant neoplasm of breast: Secondary | ICD-10-CM | POA: Insufficient documentation

## 2024-02-01 ENCOUNTER — Other Ambulatory Visit: Payer: Self-pay | Admitting: Internal Medicine

## 2024-05-17 ENCOUNTER — Ambulatory Visit: Admitting: Internal Medicine
